# Patient Record
Sex: Female | Born: 1960 | Race: Black or African American | Hispanic: No | Marital: Single | Smoking: Never smoker
Health system: Southern US, Community
[De-identification: ages and names within clinical notes are randomized; demographics above are authoritative.]

## PROBLEM LIST (undated history)

## (undated) DIAGNOSIS — I10 Essential (primary) hypertension: Secondary | ICD-10-CM

## (undated) DIAGNOSIS — M1990 Unspecified osteoarthritis, unspecified site: Secondary | ICD-10-CM

## (undated) HISTORY — DX: Unspecified osteoarthritis, unspecified site: M19.90

## (undated) HISTORY — PX: MYOMECTOMY: SHX85

---

## 1998-10-01 ENCOUNTER — Inpatient Hospital Stay (HOSPITAL_COMMUNITY): Admission: AD | Admit: 1998-10-01 | Discharge: 1998-10-03 | Payer: Self-pay | Admitting: Obstetrics and Gynecology

## 1998-10-01 ENCOUNTER — Encounter: Payer: Self-pay | Admitting: Obstetrics and Gynecology

## 1998-10-03 ENCOUNTER — Encounter (HOSPITAL_COMMUNITY): Admission: RE | Admit: 1998-10-03 | Discharge: 1999-01-01 | Payer: Self-pay | Admitting: Obstetrics and Gynecology

## 1998-12-31 ENCOUNTER — Other Ambulatory Visit: Admission: RE | Admit: 1998-12-31 | Discharge: 1998-12-31 | Payer: Self-pay | Admitting: Obstetrics and Gynecology

## 1999-01-02 ENCOUNTER — Encounter (HOSPITAL_COMMUNITY): Admission: RE | Admit: 1999-01-02 | Discharge: 1999-04-02 | Payer: Self-pay | Admitting: *Deleted

## 2000-01-23 ENCOUNTER — Other Ambulatory Visit: Admission: RE | Admit: 2000-01-23 | Discharge: 2000-01-23 | Payer: Self-pay | Admitting: Obstetrics and Gynecology

## 2002-08-10 ENCOUNTER — Other Ambulatory Visit: Admission: RE | Admit: 2002-08-10 | Discharge: 2002-08-10 | Payer: Self-pay | Admitting: Obstetrics and Gynecology

## 2003-08-31 ENCOUNTER — Other Ambulatory Visit: Admission: RE | Admit: 2003-08-31 | Discharge: 2003-08-31 | Payer: Self-pay | Admitting: Obstetrics and Gynecology

## 2004-10-16 ENCOUNTER — Other Ambulatory Visit: Admission: RE | Admit: 2004-10-16 | Discharge: 2004-10-16 | Payer: Self-pay | Admitting: Obstetrics and Gynecology

## 2005-11-06 ENCOUNTER — Other Ambulatory Visit: Admission: RE | Admit: 2005-11-06 | Discharge: 2005-11-06 | Payer: Self-pay | Admitting: Obstetrics and Gynecology

## 2009-12-18 ENCOUNTER — Encounter: Payer: Self-pay | Admitting: Internal Medicine

## 2009-12-18 LAB — CONVERTED CEMR LAB: Pap Smear: NORMAL

## 2010-03-05 ENCOUNTER — Encounter: Payer: Self-pay | Admitting: Internal Medicine

## 2010-03-19 ENCOUNTER — Ambulatory Visit: Payer: Self-pay | Admitting: Internal Medicine

## 2010-03-19 DIAGNOSIS — E669 Obesity, unspecified: Secondary | ICD-10-CM | POA: Insufficient documentation

## 2010-03-19 DIAGNOSIS — E119 Type 2 diabetes mellitus without complications: Secondary | ICD-10-CM

## 2010-03-24 ENCOUNTER — Telehealth (INDEPENDENT_AMBULATORY_CARE_PROVIDER_SITE_OTHER): Payer: Self-pay | Admitting: *Deleted

## 2010-04-11 ENCOUNTER — Encounter: Payer: Self-pay | Admitting: Gastroenterology

## 2010-05-14 ENCOUNTER — Encounter (INDEPENDENT_AMBULATORY_CARE_PROVIDER_SITE_OTHER): Payer: Self-pay | Admitting: *Deleted

## 2010-05-16 ENCOUNTER — Ambulatory Visit: Payer: Self-pay | Admitting: Gastroenterology

## 2010-05-30 ENCOUNTER — Ambulatory Visit: Payer: Self-pay | Admitting: Gastroenterology

## 2010-06-04 ENCOUNTER — Encounter: Payer: Self-pay | Admitting: Gastroenterology

## 2010-06-12 ENCOUNTER — Encounter: Payer: Self-pay | Admitting: Internal Medicine

## 2010-06-18 ENCOUNTER — Ambulatory Visit: Payer: Self-pay | Admitting: Internal Medicine

## 2010-06-18 LAB — CONVERTED CEMR LAB
ALT: 21 units/L (ref 0–35)
AST: 22 units/L (ref 0–37)
Albumin: 4 g/dL (ref 3.5–5.2)
Alkaline Phosphatase: 99 units/L (ref 39–117)
BUN: 13 mg/dL (ref 6–23)
Basophils Absolute: 0.1 10*3/uL (ref 0.0–0.1)
Basophils Relative: 0.8 % (ref 0.0–3.0)
Bilirubin, Direct: 0.1 mg/dL (ref 0.0–0.3)
CO2: 29 meq/L (ref 19–32)
Calcium: 9.7 mg/dL (ref 8.4–10.5)
Chloride: 101 meq/L (ref 96–112)
Creatinine, Ser: 0.7 mg/dL (ref 0.4–1.2)
Eosinophils Absolute: 0.1 10*3/uL (ref 0.0–0.7)
Eosinophils Relative: 2 % (ref 0.0–5.0)
GFR calc non Af Amer: 111.52 mL/min (ref >60)
Glucose, Bld: 81 mg/dL (ref 70–99)
HCT: 38.7 % (ref 36.0–46.0)
Hemoglobin: 13 g/dL (ref 12.0–15.0)
Hgb A1c MFr Bld: 6.4 % (ref 4.6–6.5)
Lymphocytes Relative: 31.7 % (ref 12.0–46.0)
Lymphs Abs: 2.3 10*3/uL (ref 0.7–4.0)
MCHC: 33.6 g/dL (ref 30.0–36.0)
MCV: 84.2 fL (ref 78.0–100.0)
Monocytes Absolute: 0.5 10*3/uL (ref 0.1–1.0)
Monocytes Relative: 6.4 % (ref 3.0–12.0)
Neutro Abs: 4.2 10*3/uL (ref 1.4–7.7)
Neutrophils Relative %: 59.1 % (ref 43.0–77.0)
Platelets: 275 10*3/uL (ref 150.0–400.0)
Potassium: 4.3 meq/L (ref 3.5–5.1)
RBC: 4.59 M/uL (ref 3.87–5.11)
RDW: 15.9 % — ABNORMAL HIGH (ref 11.5–14.6)
Sodium: 139 meq/L (ref 135–145)
Total Bilirubin: 0.5 mg/dL (ref 0.3–1.2)
Total Protein: 7.5 g/dL (ref 6.0–8.3)
WBC: 7.1 10*3/uL (ref 4.5–10.5)

## 2010-06-22 ENCOUNTER — Encounter: Payer: Self-pay | Admitting: Internal Medicine

## 2010-10-30 NOTE — Procedures (Signed)
Summary: Colonoscopy  Patient: [name] Note: All result statuses are Final unless otherwise noted.  Tests: (1) Colonoscopy (COL)   COL Colonoscopy           DONE     [facility]     [address]     [place], [place]  [address]           COLONOSCOPY PROCEDURE REPORT     PATIENT:  Lauren Bowen, Lauren Bowen  MR#:  [id number]     BIRTHDATE:  [birth year], 49 yrs. old  GENDER:  female     ENDOSCOPIST:  Judie Petit T. Russella Dar, MD, [facility]     Referred by:  Etta Grandchild, M.D.     PROCEDURE DATE:  05/30/2010     PROCEDURE:  Colonoscopy with snare polypectomy     ASA CLASS:  Class II     INDICATIONS:  1) Routine Risk Screening     MEDICATIONS:   Fentanyl 50 mcg IV, Versed 7 mg IV     DESCRIPTION OF PROCEDURE:   After the risks benefits and     alternatives of the procedure were thoroughly explained, informed     consent was obtained.  Digital rectal exam was performed and     revealed no abnormalities.   The LB [id number]-[id number] endoscope     was introduced through the anus and advanced to the cecum, which     was identified by both the appendix and ileocecal valve, without     limitations.  The quality of the prep was excellent, using     MoviPrep.  The instrument was then slowly withdrawn as the colon     was fully examined.     <<PROCEDUREIMAGES>>     FINDINGS:  A pedunculated polyp was found in the descending colon.     It was 6 mm in size. Polyp was snared without cautery. Retrieval     was successful. A normal appearing cecum, ileocecal valve, and     appendiceal orifice were identified. The ascending, hepatic     flexure, transverse, splenic flexure, sigmoid colon, and rectum     appeared unremarkable.  Retroflexed views in the rectum revealed     no abnormalities. The time to cecum =  3.75  minutes. The scope     was then withdrawn (time =  9.5  min) from the patient and the     procedure completed.           COMPLICATIONS:  None           ENDOSCOPIC IMPRESSION:     1) 6 mm  pedunculated polyp in the descending colon           RECOMMENDATIONS:     1) Await pathology results     2) If the polyp removed is adenomatous (pre-cancerous), you will     need a repeat colonoscopy in 5 years. Otherwise follow colorectal     cancer screening for "routine risk" patients with colonoscopy in     10 years.           Venita Lick. Russella Dar, MD, [facility]           n.     eSIGNED:   Venita Lick. Stark at 05/30/2010 10:09 AM           Adrian Prince, [id number]  Note: An exclamation mark (!) indicates a result that was not dispersed into the flowsheet. Document Creation Date: 05/30/2010 10:10 AM _______________________________________________________________________  (1) Order  result status: Final Collection or observation date-time: 05/30/2010 09:57 Requested date-time:  Receipt date-time:  Reported date-time:  Referring Physician:   Ordering Physician: Claudette Head [phone]) Specimen Source:  Source: Launa Grill Order Number: [id number] Lab site:   Appended Document: Colonoscopy     Procedures Next Due Date:    Colonoscopy: 05/2020

## 2010-10-30 NOTE — Letter (Signed)
Summary: Results Follow-up Letter  [facility]  [address] [place], [place] [address]   Phone: [phone]  Fax: [phone]    06/22/2010  [address] [place], [place]  [address]  Dear Ms. Haraway,   The following are the results of your recent test(s):  Test       Result     Average blood sugars   normal Liver/kidney     normal Thyroid       normal   _________________________________________________________  Please call for an appointment as directed _________________________________________________________ _________________________________________________________ _________________________________________________________  Sincerely,  Sanda Linger MD [facility]

## 2010-10-30 NOTE — Letter (Signed)
Summary: Previsit letter  [facility]  [address] [place], [place] [address]   Phone: [phone]  Fax: [phone]       04/11/2010 MRN: [id number]  [facility] [address] [place], [place]  [address]  Dear Ms. Bonsignore,  Welcome to the Gastroenterology Division at [facility].    You are scheduled to see a nurse for your pre-procedure visit on 05-16-10 at 10:30am on the 3rd floor at [facility], [address]  We ask that you try to arrive at our office 15 minutes prior to your appointment time to allow for check-in.  Your nurse visit will consist of discussing your medical and surgical history, your immediate family medical history, and your medications.    Please bring a complete list of all your medications or, if you prefer, bring the medication bottles and we will list them.  We will need to be aware of both prescribed and over the counter drugs.  We will need to know exact dosage information as well.  If you are on blood thinners (Coumadin, Plavix, Aggrenox, Ticlid, etc.) please call our office today/prior to your appointment, as we need to consult with your physician about holding your medication.   Please be prepared to read and sign documents such as consent forms, a financial agreement, and acknowledgement forms.  If necessary, and with your consent, a friend or relative is welcome to sit-in on the nurse visit with you.  Please bring your insurance card so that we may make a copy of it.  If your insurance requires a referral to see a specialist, please bring your referral form from your primary care physician.  No co-pay is required for this nurse visit.     If you cannot keep your appointment, please call [phone] to cancel or reschedule prior to your appointment date.  This allows [place] the opportunity to schedule an appointment for another patient in need of care.    Thank you for choosing  Gastroenterology for your medical  needs.  We appreciate the opportunity to care for you.  Please visit [place] at our website  to learn more about our practice.                     Sincerely.                                                                                                                   The Gastroenterology Division

## 2010-10-30 NOTE — Letter (Signed)
Summary: [facility] Instructions  [facility]  [address] [place], [place] [address]   Phone: [phone]  Fax: [phone]       Lauren Bowen    [birth year]    MRN: [id number]        Procedure Day /Date:  Friday 05/30/2010     Arrival Time: 8:30 am      Procedure Time: 9:30 am     Location of Procedure:                    _x _  [facility] (4th Floor)                        PREPARATION FOR COLONOSCOPY WITH MOVIPREP   Starting 5 days prior to your procedure Sunday 8/31 do not eat nuts, seeds, popcorn, corn, beans, peas,  salads, or any raw vegetables.  Do not take any fiber supplements (e.g. Metamucil, Citrucel, and Benefiber).  THE DAY BEFORE YOUR PROCEDURE         DATE: Thursday 9/1  1.  Drink clear liquids the entire day-NO SOLID FOOD  2.  Do not drink anything colored red or purple.  Avoid juices with pulp.  No orange juice.  3.  Drink at least 64 oz. (8 glasses) of fluid/clear liquids during the day to prevent dehydration and help the prep work efficiently.  CLEAR LIQUIDS INCLUDE: Water Jello Ice Popsicles Tea (sugar ok, no milk/cream) Powdered fruit flavored drinks Coffee (sugar ok, no milk/cream) Gatorade Juice: apple, white grape, white cranberry  Lemonade Clear bullion, consomm, broth Carbonated beverages (any kind) Strained chicken noodle soup Hard Candy                             4.  In the morning, mix first dose of MoviPrep solution:    Empty 1 Pouch A and 1 Pouch B into the disposable container    Add lukewarm drinking water to the top line of the container. Mix to dissolve    Refrigerate (mixed solution should be used within 24 hrs)  5.  Begin drinking the prep at 5:00 p.m. The MoviPrep container is divided by 4 marks.   Every 15 minutes drink the solution down to the next mark (approximately 8 oz) until the full liter is complete.   6.  Follow completed prep with 16 oz of clear liquid of your choice (Nothing red  or purple).  Continue to drink clear liquids until bedtime.  7.  Before going to bed, mix second dose of MoviPrep solution:    Empty 1 Pouch A and 1 Pouch B into the disposable container    Add lukewarm drinking water to the top line of the container. Mix to dissolve    Refrigerate  THE DAY OF YOUR PROCEDURE      DATE: Friday 9/2  Beginning at 4:30 a.m. (5 hours before procedure):         1. Every 15 minutes, drink the solution down to the next mark (approx 8 oz) until the full liter is complete.  2. Follow completed prep with 16 oz. of clear liquid of your choice.    3. You may drink clear liquids until 7:30 am (2 HOURS BEFORE PROCEDURE).   MEDICATION INSTRUCTIONS  Unless otherwise instructed, you should take regular prescription medications with a small sip of water   as early as possible the morning of  your procedure.     Additional medication instructions: n/a         OTHER INSTRUCTIONS  You will need a responsible adult at least 52 years of age to accompany you and drive you home.   This person must remain in the waiting room during your procedure.  Wear loose fitting clothing that is easily removed.  Leave jewelry and other valuables at home.  However, you may wish to bring a book to read or  an iPod/MP3 player to listen to music as you wait for your procedure to start.  Remove all body piercing jewelry and leave at home.  Total time from sign-in until discharge is approximately 2-3 hours.  You should go home directly after your procedure and rest.  You can resume normal activities the  day after your procedure.  The day of your procedure you should not:   Drive   Make legal decisions   Operate machinery   Drink alcohol   Return to work  You will receive specific instructions about eating, activities and medications before you leave.    The above instructions have been reviewed and explained to me by   Sherren Kerns RN  May 16, 2010 10:56  AM     I fully understand and can verbalize these instructions _____________________________ Date _________

## 2010-10-30 NOTE — Letter (Signed)
Summary: [facility]  [facility]   Imported By: Sherian Rein 06/16/2010 10:35:51  _____________________________________________________________________  External Attachment:    Type:   Image     Comment:   External Document

## 2010-10-30 NOTE — Letter (Signed)
Summary: [facility]   Imported By: Lennie Odor 03/27/2010 14:53:08  _____________________________________________________________________  External Attachment:    Type:   Image     Comment:   External Document

## 2010-10-30 NOTE — Letter (Signed)
Summary: Patient Notice- Colon Biospy Results  [facility]  [address] [place], [place] [address]   Phone: [phone]  Fax: [phone]        June 04, 2010 MRN: [id number]    Lauren Bowen [address] [place], [place]  [address]    Dear Ms. Wescoat,  I am pleased to inform you that the biopsies taken during your recent colonoscopy did not show any evidence of cancer or precancerous changes upon pathologic examination. The biopsy showed granulation tissue.   Continue with the treatment plan as outlined on the day of your      exam.  You should have a repeat colonoscopy examination in 10 years.  Please call us if you are having persistent problems or have questions about your condition that have not been fully answered at this time.  Sincerely,  Meryl Dare MD [facility]  This letter has been electronically signed by your physician.  Appended Document: Patient Notice- Colon Biospy Results letter mailed

## 2010-10-30 NOTE — Assessment & Plan Note (Signed)
Summary: 3 mos f/u #/cd   Vital Signs:  Patient profile:   50 year old female Height:      63 inches Weight:      348.50 pounds BMI:     61.96 O2 Sat:      99 % on Room air Temp:     97.7 degrees F oral Pulse rate:   78 / minute Pulse rhythm:   regular Resp:     16 per minute BP sitting:   128 / 88  (left arm) Cuff size:   large  Vitals Entered By: Rock Nephew CMA (June 18, 2010 3:18 PM)  Nutrition Counseling: Patient's BMI is greater than 25 and therefore counseled on weight management options.  O2 Flow:  Room air  Primary Care Provider:  Etta Grandchild MD   History of Present Illness:  Follow-Up Visit      This is a 50 year old woman who presents for Follow-up visit.  The patient denies chest pain, palpitations, dizziness, syncope, low blood sugar symptoms, high blood sugar symptoms, edema, SOB, DOE, PND, and orthopnea.  Since the last visit the patient notes no new problems or concerns.  The patient reports taking meds as prescribed and dietary compliance.  When questioned about possible medication side effects, the patient notes none.    Preventive Screening-Counseling & Management  Alcohol-Tobacco     Alcohol drinks/day: 0     Smoking Status: never     Passive Smoke Exposure: no     Tobacco Counseling: not indicated; no tobacco use  Current Medications (verified): 1)  None  Allergies (verified): No Known Drug Allergies  Past History:  Past Medical History: Last updated: 03/19/2010 Unremarkable  Past Surgical History: Last updated: 03/19/2010 Denies surgical history  Family History: Last updated: 03/19/2010 Family History Diabetes 1st degree relative Family History High cholesterol Family History Hypertension Family History of Stroke M 1st degree relative <50  Social History: Last updated: 03/19/2010 Occupation: Technical brewer Married Never Smoked Alcohol use-no Drug use-no Regular exercise-yes  Risk Factors: Alcohol Use: 0  (06/18/2010) Exercise: yes (03/19/2010)  Risk Factors: Smoking Status: never (06/18/2010) Passive Smoke Exposure: no (06/18/2010)  Family History: Reviewed history from 03/19/2010 and no changes required. Family History Diabetes 1st degree relative Family History High cholesterol Family History Hypertension Family History of Stroke M 1st degree relative <50  Social History: Reviewed history from 03/19/2010 and no changes required. 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   Endo:  Denies cold intolerance, excessive hunger, excessive thirst, excessive urination, heat intolerance, polyuria, and weight change.  Physical Exam  General:  alert, well-developed, well-nourished, well-hydrated, appropriate dress, normal appearance, healthy-appearing, cooperative to examination, good hygiene, and overweight-appearing.   Head:  normocephalic and atraumatic.   Mouth:  Oral mucosa and oropharynx without lesions or exudates.  Teeth in good repair. Neck:  supple, full ROM, no masses, no thyromegaly, no thyroid nodules or tenderness, no JVD, and normal carotid upstroke.   Lungs:  normal respiratory effort, no intercostal retractions, no accessory muscle use, normal breath sounds, no dullness, no fremitus, no crackles, and no wheezes.   Heart:  normal rate, regular rhythm, no murmur, no gallop, no rub, and no JVD.   Abdomen:  soft, non-tender, normal bowel sounds, no distention, no masses, no guarding, no rigidity, no hepatomegaly,  and no splenomegaly.   Msk:  normal ROM, no joint tenderness, no joint swelling, no joint warmth, no redness over joints, no joint deformities, no joint instability, no crepitation, and no muscle atrophy.   Pulses:  R and L  carotid,radial,femoral,dorsalis pedis and posterior tibial pulses are full and equal bilaterally Extremities:  trace left pedal edema and trace right pedal edema.   Neurologic:  No cranial nerve deficits noted. Station and gait are normal. Plantar reflexes are down-going bilaterally. DTRs are symmetrical throughout. Sensory, motor and coordinative functions appear intact. Skin:  turgor normal, color normal, no rashes, no suspicious lesions, no ecchymoses, no petechiae, no purpura, no ulcerations, and no edema.   Cervical Nodes:  no anterior cervical adenopathy and no posterior cervical adenopathy.   Psych:  Cognition and judgment appear intact. Alert and cooperative with normal attention span and concentration. No apparent delusions, illusions, hallucinations  Diabetes Management Exam:    Foot Exam (with socks and/or shoes not present):       Sensory-Pinprick/Light touch:          Left medial foot (L-4): normal          Left dorsal foot (L-5): normal          Left lateral foot (S-1): normal          Right medial foot (L-4): normal          Right dorsal foot (L-5): normal          Right lateral foot (S-1): normal       Sensory-Monofilament:          Left foot: normal          Right foot: normal       Inspection:          Left foot: normal          Right foot: normal       Nails:          Left foot: normal          Right foot: normal   Impression & Recommendations:  Problem # 1:  DIABETES-TYPE 2 (ICD-250.00) Assessment Unchanged  Orders: Venipuncture (04540) TLB-BMP (Basic Metabolic Panel-BMET) (80048-METABOL) TLB-CBC Platelet - w/Differential (85025-CBCD) TLB-Hepatic/Liver Function Pnl (80076-HEPATIC) TLB-A1C / Hgb A1C (Glycohemoglobin) (83036-A1C)  Problem # 2:  OBESITY (ICD-278.00) Assessment: Improved  Orders: Venipuncture ([address]) TLB-BMP (Basic Metabolic Panel-BMET) (80048-METABOL) TLB-CBC Platelet - w/Differential (85025-CBCD) TLB-Hepatic/Liver Function Pnl  (80076-HEPATIC) TLB-A1C / Hgb A1C (Glycohemoglobin) (83036-A1C)  Ht: 63 (06/18/2010)   Wt: 348.50 (06/18/2010)   BMI: 61.96 (06/18/2010)  Patient Instructions: 1)  Please schedule a follow-up appointment in 3 months. 2)  It is important that you exercise regularly at least 20 minutes 5 times a week. If you develop chest pain, have severe difficulty breathing, or feel very tired , stop exercising immediately and seek medical attention. 3)  You need to lose weight. Consider a lower calorie diet and regular exercise.  4)  Check your blood sugars regularly. If your readings are usually above 200 or below 70 you should contact our office. 5)  It is important that your Diabetic A1c level is checked every 3 months. 6)  See your eye doctor yearly to check for diabetic eye damage. 7)  Check your feet each night for sore areas, calluses or signs of infection. 8)  Check your Blood Pressure regularly. If it is above 130/80: you should make an appointment.

## 2010-10-30 NOTE — Progress Notes (Signed)
  Phone Note Other Incoming   Request: Send information Summary of Call: Records received from [facility]. 13 pages forwarded to Dr. Yetta Barre for review.      Appended Document:  correction 6 pages forwarded to Dr. Yetta Barre for review.

## 2010-10-30 NOTE — Miscellaneous (Signed)
Summary: previsit/rm  Clinical Lists Changes  Medications: Added new medication of MOVIPREP 100 GM  SOLR (PEG-KCL-NACL-NASULF-NA ASC-C) As per prep instructions. - Signed Rx of MOVIPREP 100 GM  SOLR (PEG-KCL-NACL-NASULF-NA ASC-C) As per prep instructions.;  #1 x 0;  Signed;  Entered by: Sherren Kerns RN;  Authorized by: Meryl Dare MD [facility];  Method used: Electronically to [facility]. #[id number]*, [address], [place], [place]  [address], Ph: [phone], Fax: [phone] Observations: Added new observation of ALLERGY REV: Done (05/16/2010 10:16)    Prescriptions: MOVIPREP 100 GM  SOLR (PEG-KCL-NACL-NASULF-NA ASC-C) As per prep instructions.  #1 x 0   Entered by:   Sherren Kerns RN   Authorized by:   Meryl Dare MD [facility]   Signed by:   Sherren Kerns RN on 05/16/2010   Method used:   Electronically to        [id number] (retail)       [address] [place], [place]  [address]       Ph: [phone]       Fax: [phone]   RxID:   [id number]

## 2010-10-30 NOTE — Assessment & Plan Note (Signed)
Summary: NEW [employer] PT--ELEVATED GLUCOSE/A1C-PER KAREN/DR TOMBLIN-#--STC   Vital Signs:  Patient profile:   50 year old female Height:      63 inches Weight:      354 pounds BMI:     62.93 O2 Sat:      93 % on Room air Temp:     97.8 degrees F oral Pulse rate:   90 / minute Pulse rhythm:   regular Resp:     16 per minute BP sitting:   114 / 74  (left arm) Cuff size:   large  Vitals Entered By: Rock Nephew CMA (March 19, 2010 1:19 PM)  Nutrition Counseling: Patient's BMI is greater than 25 and therefore counseled on weight management options.  O2 Flow:  Room air  Primary Care Provider:  Etta Grandchild MD   History of Present Illness: New to me she was referreed by [facility] for A1C=6.1. She does not want to start meds., instead she is losing weight with diet and exercise.  Preventive Screening-Counseling & Management  Alcohol-Tobacco     Alcohol drinks/day: 0     Smoking Status: never     Passive Smoke Exposure: no  Caffeine-Diet-Exercise     Does Patient Exercise: yes     Type of exercise: walking     Exercise (avg: min/session): 30-60     Times/week: 4     Exercise Counseling: to improve exercise regimen  Hep-HIV-STD-Contraception     Hepatitis Risk: no risk noted     HIV Risk: no risk noted     STD Risk: no risk noted     SBE monthly: yes     SBE Education/Counseling: to perform regular SBE  Safety-Violence-Falls     Seat Belt Use: yes     Helmet Use: yes     Firearms in the Home: no firearms in the home     Smoke Detectors: yes     Violence in the Home: no risk noted     Sexual Abuse: no      Sexual History:  currently monogamous.        Drug Use:  no.        Blood Transfusions:  no.    Current Medications (verified): 1)  None  Allergies (verified): No Known Drug Allergies  Past History:  Past Medical History: Unremarkable  Past Surgical History: Denies surgical history  Family History: Family History Diabetes 1st degree relative Family  History High cholesterol Family History Hypertension Family History of Stroke M 1st degree relative <50  Social History: Occupation: Technical brewer Married Never Smoked Alcohol use-no Drug use-no Regular exercise-yes Smoking Status:  never Does Patient Exercise:  yes Passive Smoke Exposure:  no Hepatitis Risk:  no risk noted HIV Risk:  no risk noted STD Risk:  no risk noted Seat Belt Use:  yes Sexual History:  currently monogamous Blood Transfusions:  no Drug Use:  no  Review of Systems  The patient denies chest pain, syncope, dyspnea on exertion, peripheral edema, prolonged cough, headaches, hemoptysis, abdominal pain, melena, hematochezia, severe indigestion/heartburn, and enlarged lymph nodes.   Endo:  Complains of excessive thirst; denies cold intolerance, excessive hunger, excessive urination, heat intolerance, polyuria, and weight change.  Physical Exam  General:  alert, well-developed, well-nourished, well-hydrated, appropriate dress, normal appearance, healthy-appearing, cooperative to examination, good hygiene, and overweight-appearing.   Head:  normocephalic and atraumatic.   Eyes:  vision grossly intact and no injection.   Mouth:  Oral mucosa and oropharynx without lesions or  exudates.  Teeth in good repair. Neck:  supple, full ROM, no masses, no thyromegaly, no thyroid nodules or tenderness, no JVD, and normal carotid upstroke.   Lungs:  normal respiratory effort, no intercostal retractions, no accessory muscle use, normal breath sounds, no dullness, no fremitus, no crackles, and no wheezes.   Heart:  normal rate, regular rhythm, no murmur, no gallop, no rub, and no JVD.   Abdomen:  soft, non-tender, normal bowel sounds, no distention, no masses, no guarding, no rigidity, no hepatomegaly, and no splenomegaly.   Msk:  normal ROM, no joint tenderness, no joint swelling, no joint warmth, no redness over joints, no joint deformities, no joint instability, no crepitation, and  no muscle atrophy.   Pulses:  R and L carotid,radial,femoral,dorsalis pedis and posterior tibial pulses are full and equal bilaterally Extremities:  trace left pedal edema and trace right pedal edema.   Neurologic:  No cranial nerve deficits noted. Station and gait are normal. Plantar reflexes are down-going bilaterally. DTRs are symmetrical throughout. Sensory, motor and coordinative functions appear intact. Skin:  turgor normal, color normal, no rashes, no suspicious lesions, no ecchymoses, no petechiae, no purpura, no ulcerations, and no edema.   Cervical Nodes:  no anterior cervical adenopathy and no posterior cervical adenopathy.   Axillary Nodes:  no R axillary adenopathy and no L axillary adenopathy.   Inguinal Nodes:  no R inguinal adenopathy and no L inguinal adenopathy.   Psych:  Cognition and judgment appear intact. Alert and cooperative with normal attention span and concentration. No apparent delusions, illusions, hallucinations   Impression & Recommendations:  Problem # 1:  DIABETES-TYPE 2 (ICD-250.00) Assessment New  Orders: Nutrition Referral (Nutrition)  Problem # 2:  OBESITY (ICD-278.00) Assessment: New  Orders: Nutrition Referral (Nutrition)  Ht: 63 (03/19/2010)   Wt: 354 (03/19/2010)   BMI: 62.93 (03/19/2010)  Problem # 3:  ROUTINE GENERAL MEDICAL EXAM@HEALTH  CARE FACL (ICD-V70.0)  Orders: Gastroenterology Referral (GI)  Mammogram: normal (03/05/2010) Pap smear: normal (12/18/2009) Td Booster: Tdap (04/02/2005)    Discussed using sunscreen, use of alcohol, drug use, self breast exam, routine dental care, routine eye care, schedule for GYN exam, routine physical exam, seat belts, multiple vitamins, osteoporosis prevention, adequate calcium intake in diet, recommendations for immunizations, mammograms and Pap smears.  Discussed exercise and checking cholesterol.    Colorectal Screening:  Current Recommendations:    Colonoscopy recommended: scheduled with  [facility]  PAP Screening:    Hx Cervical Dysplasia in last 5 yrs? No    3 normal PAP smears in last 5 yrs? Yes    Last PAP smear:  12/18/2009  Mammogram Screening:    Last Mammogram:  03/05/2010  Osteoporosis Risk Assessment:  Risk Factors for Fracture or Low Bone Density:   Race (White or Asian):     no   Hx of Fractures:       no   FH of Osteoporosis:     no   Hx of falls:       no   Physically inactive:     no   Smoking status:       never   High alcohol use:     no   High caffeine use:     no   Low calcium/Vit. D intake:   no   Corticosteroid use:     no   Thyroid replacement:     no   Dilantin use:       no   Heparin use:  no   Thyroid disease:     no   Rheumatoid Arthritis:     no   Parathyroid disease:     no  Immunization & Chemoprophylaxis:    Tetanus vaccine: Tdap  (04/02/2005)  Patient Instructions: 1)  Please schedule a follow-up appointment in 3 months. 2)  It is important that you exercise regularly at least 20 minutes 5 times a week. If you develop chest pain, have severe difficulty breathing, or feel very tired , stop exercising immediately and seek medical attention. 3)  You need to lose weight. Consider a lower calorie diet and regular exercise.  4)  Check your blood sugars regularly. If your readings are usually above 200 or below 70 you should contact our office. 5)  It is important that your Diabetic A1c level is checked every 3 months. 6)  See your eye doctor yearly to check for diabetic eye damage. 7)  Check your feet each night for sore areas, calluses or signs of infection.  Preventive Care Screening  Mammogram:    Date:  03/05/2010    Results:  normal   Pap Smear:    Date:  12/18/2009    Results:  normal     Tetanus/Td Immunization History:    Tetanus/Td # 1:  Tdap (04/02/2005)

## 2010-10-30 NOTE — Letter (Signed)
[facility]   Imported By: Sherian Rein 03/21/2010 10:13:52  _____________________________________________________________________  External Attachment:    Type:   Image     Comment:   External Document

## 2010-10-30 NOTE — Letter (Signed)
Summary: [facility]   Imported By: Sherian Rein 03/21/2010 10:11:55  _____________________________________________________________________  External Attachment:    Type:   Image     Comment:   External Document

## 2011-01-01 ENCOUNTER — Encounter: Payer: Self-pay | Admitting: Internal Medicine

## 2012-06-10 ENCOUNTER — Ambulatory Visit (INDEPENDENT_AMBULATORY_CARE_PROVIDER_SITE_OTHER): Payer: BC Managed Care – PPO | Admitting: Physician Assistant

## 2012-06-10 VITALS — BP 156/93 | HR 84 | Temp 100.4°F | Resp 16 | Ht 67.0 in | Wt 354.0 lb

## 2012-06-10 DIAGNOSIS — B029 Zoster without complications: Secondary | ICD-10-CM

## 2012-06-10 MED ORDER — VALACYCLOVIR HCL 1 G PO TABS: 1.0000 g | ORAL_TABLET | Freq: Three times a day (TID) | ORAL | Status: AC

## 2012-06-10 NOTE — Progress Notes (Signed)
  Subjective:    Patient ID: Lauren Bowen, female    DOB: May 02, 1961, 51 y.o.   MRN: [id number]  HPI  Pt presents to clinic with a rash on her back for about 1 wk.  She thought it was her bra that was bothering her but then yesterday she noticed a red blistered rash on her R breast.  She had chicken pox as a child.  The rash is pin prickly and itchy, not significantly painful.  Review of Systems  Skin: Positive for rash.  Neurological: Negative for headaches.       Objective:   Physical Exam  Vitals reviewed. Constitutional: She is oriented to person, place, and time. She appears well-developed and well-nourished.  HENT:  Head: Normocephalic and atraumatic.  Right Ear: External ear normal.  Left Ear: External ear normal.  Eyes: Conjunctivae normal are normal.  Pulmonary/Chest: Effort normal.  Neurological: She is alert and oriented to person, place, and time.  Skin: Skin is warm and dry. Rash noted.       Vesicular scabbed over rash on R back and new erythematous rash on R breast.  This rash is all in a single dermatome.  Psychiatric: She has a normal mood and affect. Her behavior is normal. Judgment and thought content normal.          Assessment & Plan:   1. Shingles  valACYclovir (VALTREX) 1000 MG tablet   D/w pt post herpatic neuralgia and if she develops pain to RTC for start of neurontin.  Pt understands and agrees.

## 2013-01-27 ENCOUNTER — Ambulatory Visit (INDEPENDENT_AMBULATORY_CARE_PROVIDER_SITE_OTHER): Payer: BC Managed Care – PPO | Admitting: Physician Assistant

## 2013-01-27 VITALS — BP 130/80 | HR 83 | Temp 99.0°F | Resp 18 | Ht 64.0 in | Wt 362.0 lb

## 2013-01-27 DIAGNOSIS — R062 Wheezing: Secondary | ICD-10-CM

## 2013-01-27 DIAGNOSIS — R05 Cough: Secondary | ICD-10-CM

## 2013-01-27 MED ORDER — HYDROCOD POLST-CHLORPHEN POLST 10-8 MG/5ML PO LQCR: 5.0000 mL | Freq: Two times a day (BID) | ORAL | Status: DC | PRN

## 2013-01-27 MED ORDER — IPRATROPIUM BROMIDE 0.02 % IN SOLN
0.5000 mg | Freq: Once | RESPIRATORY_TRACT | Status: AC
  Administered 2013-01-27: 0.5 mg via RESPIRATORY_TRACT

## 2013-01-27 MED ORDER — AZITHROMYCIN 500 MG PO TABS: 500.0000 mg | ORAL_TABLET | Freq: Every day | ORAL | Status: DC

## 2013-01-27 MED ORDER — ALBUTEROL SULFATE (2.5 MG/3ML) 0.083% IN NEBU
2.5000 mg | INHALATION_SOLUTION | Freq: Once | RESPIRATORY_TRACT | Status: AC
  Administered 2013-01-27: 2.5 mg via RESPIRATORY_TRACT

## 2013-01-27 MED ORDER — ALBUTEROL SULFATE HFA 108 (90 BASE) MCG/ACT IN AERS: 2.0000 | INHALATION_SPRAY | RESPIRATORY_TRACT | Status: DC | PRN

## 2013-01-27 NOTE — Progress Notes (Signed)
  Subjective:    Patient ID: Lauren Bowen, female    DOB: May 14, 1961, 52 y.o.   MRN: [id number]  HPI This 52 y.o. female presents for evaluation of cough x 1 week.  Worse with prolonged talking and after eating. No SOB, or pain with breathing.  Mild runny nose, though may have more in the mornings.  No sore throat, ear pressure or pain.  Some nausea and vomiting last week, resolved entirely.  May have been associated with lots of phlegm, or something she ate, she doesn't recall.  No fever or chills, unexplained myalgias/arthralgias or rash.  Past medical history, surgical history, family history, social history and problem list reviewed.   Review of Systems As above.    Objective:   Physical Exam  Blood pressure 130/80, pulse 83, temperature 99 F (37.2 C), temperature source Oral, resp. rate 18, height 5\' 4"  (1.626 m), weight 362 lb (164.202 kg), SpO2 98.00%. Body mass index is 62.11 kg/(m^2). Well-developed, well nourished obese BF who is awake, alert and oriented, in NAD. HEENT: [place]/AT, PERRL, EOMI.  Sclera and conjunctiva are clear.  EAC are patent, TMs are normal in appearance. Nasal mucosa is pink and moist. OP is clear. Neck: supple, non-tender, no lymphadenopathy, thyromegaly. Heart: RRR, no murmur Lungs: normal effort, but dry cough throughout interview and exam.  Wheezes throughout. Extremities: no cyanosis, clubbing or edema. Skin: warm and dry without rash. Psychologic: good mood and appropriate affect, normal speech and behavior.  Albuterol + Atrovent neb treatment with near-complete resolution of the wheezing, and significant improvement in subjective symptoms.     Assessment & Plan:  Cough - Plan: azithromycin (ZITHROMAX) 500 MG tablet, chlorpheniramine-HYDROcodone (TUSSIONEX PENNKINETIC ER) 10-8 MG/5ML LQCR  Wheezing - Plan: albuterol (PROVENTIL) (2.5 MG/3ML) 0.083% nebulizer solution 2.5 mg, ipratropium (ATROVENT) nebulizer solution 0.5 mg, albuterol (PROVENTIL  HFA;VENTOLIN HFA) 108 (90 BASE) MCG/ACT inhaler  There is a question of diabetes in this patient.  I encouraged her to bring in her recent lab results for review, either here or to her GYN.  She is actively working on weight loss through a professional program (that drew the blood).  Fernande Bras, PA-C Physician Assistant-Certified [facility]

## 2013-01-27 NOTE — Patient Instructions (Addendum)
Get plenty of rest and drink at least 64 ounces of water daily.  Bring in your recent lab results so we can make sure we're doing everything needed to keep you as healthy as possible.

## 2013-05-29 ENCOUNTER — Ambulatory Visit: Payer: Self-pay | Admitting: Physician Assistant

## 2015-05-24 ENCOUNTER — Encounter: Payer: Self-pay | Admitting: Gastroenterology

## 2016-01-20 ENCOUNTER — Ambulatory Visit (INDEPENDENT_AMBULATORY_CARE_PROVIDER_SITE_OTHER): Payer: BLUE CROSS/BLUE SHIELD | Admitting: Family Medicine

## 2016-01-20 VITALS — BP 124/80 | HR 74 | Temp 102.4°F | Resp 18 | Ht 63.5 in | Wt 361.0 lb

## 2016-01-20 DIAGNOSIS — J039 Acute tonsillitis, unspecified: Secondary | ICD-10-CM

## 2016-01-20 DIAGNOSIS — R350 Frequency of micturition: Secondary | ICD-10-CM | POA: Diagnosis not present

## 2016-01-20 DIAGNOSIS — J020 Streptococcal pharyngitis: Secondary | ICD-10-CM

## 2016-01-20 DIAGNOSIS — J029 Acute pharyngitis, unspecified: Secondary | ICD-10-CM | POA: Diagnosis not present

## 2016-01-20 DIAGNOSIS — R509 Fever, unspecified: Secondary | ICD-10-CM

## 2016-01-20 LAB — POCT URINALYSIS DIP (MANUAL ENTRY)
Bilirubin, UA: NEGATIVE
Glucose, UA: NEGATIVE
Ketones, POC UA: NEGATIVE
Leukocytes, UA: NEGATIVE
Nitrite, UA: NEGATIVE
Protein Ur, POC: NEGATIVE
Spec Grav, UA: 1.02
Urobilinogen, UA: 0.2
pH, UA: 6

## 2016-01-20 LAB — POC MICROSCOPIC URINALYSIS (UMFC): Mucus: ABSENT

## 2016-01-20 LAB — POCT RAPID STREP A (OFFICE): Rapid Strep A Screen: POSITIVE — AB

## 2016-01-20 MED ORDER — PENICILLIN V POTASSIUM 500 MG PO TABS: 500.0000 mg | ORAL_TABLET | Freq: Three times a day (TID) | ORAL | Status: DC

## 2016-01-20 NOTE — Progress Notes (Signed)
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 She is been sick for 2 days with urinary frequency and sore throat. She also has a fever today and was unable to get up and go to work.  Patient's primary care doctor is Dr. Ronnald Ramp on [address] [place]    Objective: BP 124/80 mmHg  Pulse 74  Temp(Src) 102.4 F (39.1 C) (Oral)  Resp 18  Ht 5' 3.5" (1.613 m)  Wt 361 lb (163.749 kg)  BMI 62.94 kg/m2  SpO2 96% This is an obese woman, middle-aged, in no acute distress. Her voice is somewhat hoarse and raspy. Oropharynx shows diffuse white exudates on both sides of her posterior pharynx covering her tonsils. Neck: Supple no adenopathy Chest: Clear Heart: Regular no murmur CVA, nontender  Results for orders placed or performed in visit on 01/20/16  POCT rapid strep A  Result Value Ref Range   Rapid Strep A Screen Positive (A) Negative  POCT urinalysis dipstick  Result Value Ref Range   Color, UA yellow yellow   Clarity, UA clear clear   Glucose, UA negative negative   Bilirubin, UA negative negative   Ketones, POC UA negative negative   Spec Grav, UA 1.020    Blood, UA trace-intact (A) negative   pH, UA 6.0    Protein Ur, POC negative negative   Urobilinogen, UA 0.2    Nitrite, UA Negative Negative   Leukocytes, UA Negative Negative  POCT Microscopic Urinalysis ([facility])  Result Value Ref Range   WBC,UR,HPF,POC Few (A) None WBC/hpf   RBC,UR,HPF,POC Few (A) None RBC/hpf   Bacteria Few (A) None, Too numerous to count   Mucus Absent Absent   Epithelial Cells, UR Per Microscopy Few (A) None, Too numerous to count cells/hpf   Fever, unspecified fever cause - Plan: penicillin v potassium (VEETID) 500 MG tablet  Sore throat - Plan: penicillin v potassium (VEETID) 500 MG tablet  Acute tonsillitis, unspecified etiology - Plan: POCT rapid strep A, penicillin v potassium (VEETID) 500 MG tablet  Urinary frequency - Plan: POCT urinalysis dipstick, POCT  Microscopic Urinalysis ([facility])  Streptococcal sore throat - Plan: penicillin v potassium (VEETID) 500 MG tablet  Robyn Haber, MD

## 2016-01-20 NOTE — Patient Instructions (Addendum)
Strep Throat Strep throat is a bacterial infection of the throat. Your health care provider may call the infection tonsillitis or pharyngitis, depending on whether there is swelling in the tonsils or at the back of the throat. Strep throat is most common during the cold months of the year in children who are 74-57 years of age, but it can happen during any season in people of any age. This infection is spread from person to person (contagious) through coughing, sneezing, or close contact. CAUSES Strep throat is caused by the bacteria called Streptococcus pyogenes. RISK FACTORS This condition is more likely to develop in:  People who spend time in crowded places where the infection can spread easily.  People who have close contact with someone who has strep throat. SYMPTOMS Symptoms of this condition include:  Fever or chills.   Redness, swelling, or pain in the tonsils or throat.  Pain or difficulty when swallowing.  White or yellow spots on the tonsils or throat.  Swollen, tender glands in the neck or under the jaw.  Red rash all over the body (rare). DIAGNOSIS This condition is diagnosed by performing a rapid strep test or by taking a swab of your throat (throat culture test). Results from a rapid strep test are usually ready in a few minutes, but throat culture test results are available after one or two days. TREATMENT This condition is treated with antibiotic medicine. HOME CARE INSTRUCTIONS Medicines  Take over-the-counter and prescription medicines only as told by your health care provider.  Take your antibiotic as told by your health care provider. Do not stop taking the antibiotic even if you start to feel better.  Have family members who also have a sore throat or fever tested for strep throat. They may need antibiotics if they have the strep infection. Eating and Drinking  Do not share food, drinking cups, or personal items that could cause the infection to spread  to other people.  If swallowing is difficult, try eating soft foods until your sore throat feels better.  Drink enough fluid to keep your urine clear or pale yellow. General Instructions  Gargle with a salt-water mixture 3-4 times per day or as needed. To make a salt-water mixture, completely dissolve -1 tsp of salt in 1 cup of warm water.  Make sure that all household members wash their hands well.  Get plenty of rest.  Stay home from school or work until you have been taking antibiotics for 24 hours.  Keep all follow-up visits as told by your health care provider. This is important. SEEK MEDICAL CARE IF:  The glands in your neck continue to get bigger.  You develop a rash, cough, or earache.  You cough up a thick liquid that is green, yellow-brown, or bloody.  You have pain or discomfort that does not get better with medicine.  Your problems seem to be getting worse rather than better.  You have a fever. SEEK IMMEDIATE MEDICAL CARE IF:  You have new symptoms, such as vomiting, severe headache, stiff or painful neck, chest pain, or shortness of breath.  You have severe throat pain, drooling, or changes in your voice.  You have swelling of the neck, or the skin on the neck becomes red and tender.  You have signs of dehydration, such as fatigue, dry mouth, and decreased urination.  You become increasingly sleepy, or you cannot wake up completely.  Your joints become red or painful.   This information is not intended to replace  advice given to you by your health care provider. Make sure you discuss any questions you have with your health care provider.   Document Released: 09/11/2000 Document Revised: 06/05/2015 Document Reviewed: 01/07/2015 Elsevier Interactive Patient Education 2016 [employer].    IF you received an x-ray today, you will receive an invoice from [facility]. Please contact [facility] at [phone] with questions or concerns  regarding your invoice.   IF you received labwork today, you will receive an invoice from [employer]. Please contact [facility] at [phone] with questions or concerns regarding your invoice.   Our billing staff will not be able to assist you with questions regarding bills from these companies.  You will be contacted with the lab results as soon as they are available. The fastest way to get your results is to activate your My Chart account. Instructions are located on the last page of this paperwork. If you have not heard from [place] regarding the results in 2 weeks, please contact this office.

## 2017-01-02 ENCOUNTER — Ambulatory Visit: Payer: BLUE CROSS/BLUE SHIELD

## 2018-03-17 ENCOUNTER — Emergency Department (HOSPITAL_COMMUNITY)
Admission: EM | Admit: 2018-03-17 | Discharge: 2018-03-17 | Disposition: A | Discharge status: Home / Self Care | Payer: BLUE CROSS/BLUE SHIELD | Attending: Emergency Medicine | Admitting: Emergency Medicine

## 2018-03-17 ENCOUNTER — Emergency Department (HOSPITAL_COMMUNITY): Payer: BLUE CROSS/BLUE SHIELD

## 2018-03-17 ENCOUNTER — Encounter (HOSPITAL_COMMUNITY): Payer: Self-pay | Admitting: Emergency Medicine

## 2018-03-17 DIAGNOSIS — Z79899 Other long term (current) drug therapy: Secondary | ICD-10-CM | POA: Insufficient documentation

## 2018-03-17 DIAGNOSIS — E119 Type 2 diabetes mellitus without complications: Secondary | ICD-10-CM | POA: Diagnosis not present

## 2018-03-17 DIAGNOSIS — R109 Unspecified abdominal pain: Secondary | ICD-10-CM | POA: Diagnosis not present

## 2018-03-17 DIAGNOSIS — I10 Essential (primary) hypertension: Secondary | ICD-10-CM | POA: Insufficient documentation

## 2018-03-17 HISTORY — DX: Essential (primary) hypertension: I10

## 2018-03-17 LAB — URINALYSIS, ROUTINE W REFLEX MICROSCOPIC
Bilirubin Urine: NEGATIVE
GLUCOSE, UA: NEGATIVE mg/dL
Ketones, ur: NEGATIVE mg/dL
Leukocytes, UA: NEGATIVE
Nitrite: NEGATIVE
PH: 5 (ref 5.0–8.0)
Protein, ur: NEGATIVE mg/dL
Specific Gravity, Urine: 1.018 (ref 1.005–1.030)

## 2018-03-17 MED ORDER — KETOROLAC TROMETHAMINE 30 MG/ML IJ SOLN
30.0000 mg | Freq: Once | INTRAMUSCULAR | Status: AC
  Administered 2018-03-17: 30 mg via INTRAMUSCULAR
  Filled 2018-03-17: qty 1

## 2018-03-17 MED ORDER — IBUPROFEN 800 MG PO TABS: 800.0000 mg | ORAL_TABLET | Freq: Three times a day (TID) | ORAL | 0 refills | Status: DC

## 2018-03-17 MED ORDER — CYCLOBENZAPRINE HCL 10 MG PO TABS: 10.0000 mg | ORAL_TABLET | Freq: Two times a day (BID) | ORAL | 0 refills | Status: DC | PRN

## 2018-03-17 NOTE — ED Triage Notes (Signed)
Patient to ED c/o L flank pain, worsening this past week. Reports she had it in February and was given muscle relaxer - taking without relief. Patient does endorse pain worse with movement and adds new onset urinary frequency yesterday. Denies other urinary symptoms or known injury. Ambulatory with steady gait.

## 2018-03-17 NOTE — ED Provider Notes (Signed)
[facility] EMERGENCY DEPARTMENT Provider Note   CSN: [id number] Arrival date & time: 03/17/18  1941     History   Chief Complaint Chief Complaint  Patient presents with  . Flank Pain    HPI Lauren Bowen is a 57 y.o. female.  HPI   57 year old female presenting complaining of left-sided back pain.  For the past 3 weeks patient has had progressive worsening pain to her left mid back.  Pain is described as a sharp sensation occasionally wraps around her torso chests, worsening with movement, laying flat, and occasional with cough.  Pain felt like a muscle aches however it has not been improving despite using over-the-counter medication and massages.  Pain is now 8 out of 10, and brought her to tears.  No associated fever, chills, lightheadedness, dizziness, exertional chest pain, shortness of breath, hemoptysis, abdominal pain, dysuria or hematuria.  She report having similar pain earlier this year that lasted for nearly a month and subsequently improved with getting a massage.  She did report performing some new exercise recently and unsure if that may have aggravated her pain.  She does not have any history of kidney disease or kidney stone.   Past Medical History:  Diagnosis Date  . Arthritis    LEFT KNEE  . Hypertension     Patient Active Problem List   Diagnosis Date Noted  . DIABETES-TYPE 2 03/19/2010  . OBESITY 03/19/2010    Past Surgical History:  Procedure Laterality Date  . MYOMECTOMY       OB History   None      Home Medications    Prior to Admission medications   Medication Sig Start Date End Date Taking? Authorizing Provider  albuterol (PROVENTIL HFA;VENTOLIN HFA) 108 (90 BASE) MCG/ACT inhaler Inhale 2 puffs into the lungs every 4 (four) hours as needed for wheezing (cough, shortness of breath or wheezing.). Patient not taking: Reported on 01/20/2016 01/27/13   Harrison Mons, PA-C  penicillin v potassium (VEETID) 500 MG tablet Take  1 tablet (500 mg total) by mouth 3 (three) times daily. 01/20/16   Robyn Haber, MD    Family History Family History  Problem Relation Age of Onset  . Diabetes Mother   . Hypertension Mother   . Hypertension Father   . Diabetes Father   . Hypertension Sister   . Heart disease Brother   . Hypertension Sister   . Kidney disease Sister   . Diabetes Maternal Grandmother   . Diabetes Maternal Grandfather   . Asthma Maternal Grandfather   . Heart disease Paternal Grandmother     Social History Social History   Tobacco Use  . Smoking status: Never Smoker  . Smokeless tobacco: Never Used  Substance Use Topics  . Alcohol use: No  . Drug use: No     Allergies   Patient has no known allergies.   Review of Systems Review of Systems  All other systems reviewed and are negative.    Physical Exam Updated Vital Signs BP (!) 157/101 (BP Location: Left Arm)   Pulse (!) 108   Temp 99.4 F (37.4 C) (Oral)   Resp 18   SpO2 100%   Physical Exam  Constitutional: She appears well-developed and well-nourished. No distress.  Moderately obese female, appears uncomfortable with movement.  HENT:  Head: Atraumatic.  Eyes: Conjunctivae are normal.  Neck: Neck supple.  Cardiovascular: Normal rate and regular rhythm.  Pulmonary/Chest: Effort normal and breath sounds normal. No respiratory distress. She  has no wheezes. She has no rales.  Abdominal: Soft. She exhibits no distension. There is no tenderness.  Genitourinary:  Genitourinary Comments: No CVA tenderness  Musculoskeletal: She exhibits tenderness (Tenderness to left-sided mid back on palpation without overlying skin changes.  No crepitus or emphysema.).  Neurological: She is alert.  Skin: No rash noted.  Psychiatric: She has a normal mood and affect.  Nursing note and vitals reviewed.    ED Treatments / Results  Labs (all labs ordered are listed, but only abnormal results are displayed) Labs Reviewed  URINALYSIS,  ROUTINE W REFLEX MICROSCOPIC - Abnormal; Notable for the following components:      Result Value   APPearance HAZY (*)    Hgb urine dipstick SMALL (*)    Bacteria, UA RARE (*)    All other components within normal limits    EKG None  Radiology Dg Chest 2 View  Result Date: 03/17/2018 CLINICAL DATA:  Back pain for 2 weeks EXAM: CHEST - 2 VIEW COMPARISON:  None. FINDINGS: Cardiac shadow is within normal limits. Lungs are clear bilaterally. Degenerative changes of the thoracic spine are seen. IMPRESSION: No acute abnormality noted. Electronically Signed   By: Inez Catalina M.D.   On: 03/17/2018 08:46    Procedures Procedures (including critical care time)  Medications Ordered in ED Medications  ketorolac (TORADOL) 30 MG/ML injection 30 mg (has no administration in time range)     Initial Impression / Assessment and Plan / ED Course  I have reviewed the triage vital signs and the nursing notes.  Pertinent labs & imaging results that were available during my care of the patient were reviewed by me and considered in my medical decision making (see chart for details).     BP 115/61   Pulse 79   Temp 99.4 F (37.4 C) (Oral)   Resp 18   SpO2 100%    Final Clinical Impressions(s) / ED Diagnoses   Final diagnoses:  Left flank pain    ED Discharge Orders        Ordered    ibuprofen (ADVIL,MOTRIN) 800 MG tablet  3 times daily     03/17/18 1006    cyclobenzaprine (FLEXERIL) 10 MG tablet  2 times daily PRN     03/17/18 1006     8:32 AM Patient here with reproducible left-sided mid back pain likely muscle skeletal in origin.  She does endorse occasional cough therefore I will obtain a chest x-ray to rule out occult pneumonia as well as assessing for her ribs status.  Given the location, will obtain UA to assess for potential kidney stones.  Otherwise I have low suspicion for acute life-threatening emergency since patient's symptom has been ongoing for nearly a month.\  10:05  AM UA without signs of urinary tract infection.  Trace hemoglobin and urine dipstick however I will have low suspicion for kidney stone causing her symptoms.  Chest x-ray unremarkable.  At this time patient discharged home with anti-inflammatory medication as well as muscle relaxant.  She will follow-up closely with her primary care doctor for further care.  Return precautions discussed.   Domenic Moras, PA-C 03/17/18 1007    Lajean Saver, MD 03/17/18 1126

## 2018-07-26 DIAGNOSIS — Z6841 Body Mass Index (BMI) 40.0 and over, adult: Secondary | ICD-10-CM | POA: Diagnosis not present

## 2018-07-26 DIAGNOSIS — Z23 Encounter for immunization: Secondary | ICD-10-CM | POA: Diagnosis not present

## 2018-07-26 DIAGNOSIS — I10 Essential (primary) hypertension: Secondary | ICD-10-CM | POA: Diagnosis not present

## 2018-08-11 DIAGNOSIS — G8929 Other chronic pain: Secondary | ICD-10-CM | POA: Diagnosis not present

## 2018-08-11 DIAGNOSIS — I10 Essential (primary) hypertension: Secondary | ICD-10-CM | POA: Diagnosis not present

## 2018-08-11 DIAGNOSIS — M25562 Pain in left knee: Secondary | ICD-10-CM | POA: Diagnosis not present

## 2018-08-11 DIAGNOSIS — M1712 Unilateral primary osteoarthritis, left knee: Secondary | ICD-10-CM | POA: Diagnosis not present

## 2018-08-22 DIAGNOSIS — M1712 Unilateral primary osteoarthritis, left knee: Secondary | ICD-10-CM | POA: Diagnosis not present

## 2018-09-08 DIAGNOSIS — I10 Essential (primary) hypertension: Secondary | ICD-10-CM | POA: Diagnosis not present

## 2018-11-01 DIAGNOSIS — Z1231 Encounter for screening mammogram for malignant neoplasm of breast: Secondary | ICD-10-CM | POA: Diagnosis not present

## 2018-11-01 DIAGNOSIS — Z6841 Body Mass Index (BMI) 40.0 and over, adult: Secondary | ICD-10-CM | POA: Diagnosis not present

## 2018-11-01 DIAGNOSIS — Z01419 Encounter for gynecological examination (general) (routine) without abnormal findings: Secondary | ICD-10-CM | POA: Diagnosis not present

## 2018-11-30 DIAGNOSIS — I10 Essential (primary) hypertension: Secondary | ICD-10-CM | POA: Diagnosis not present

## 2018-12-07 DIAGNOSIS — I10 Essential (primary) hypertension: Secondary | ICD-10-CM | POA: Diagnosis not present

## 2018-12-07 DIAGNOSIS — Z0000 Encounter for general adult medical examination without abnormal findings: Secondary | ICD-10-CM | POA: Diagnosis not present

## 2019-02-13 DIAGNOSIS — M25511 Pain in right shoulder: Secondary | ICD-10-CM | POA: Diagnosis not present

## 2019-06-12 DIAGNOSIS — Z23 Encounter for immunization: Secondary | ICD-10-CM | POA: Diagnosis not present

## 2019-06-12 DIAGNOSIS — I10 Essential (primary) hypertension: Secondary | ICD-10-CM | POA: Diagnosis not present

## 2019-06-12 DIAGNOSIS — Z6841 Body Mass Index (BMI) 40.0 and over, adult: Secondary | ICD-10-CM | POA: Diagnosis not present

## 2019-12-05 DIAGNOSIS — Z01419 Encounter for gynecological examination (general) (routine) without abnormal findings: Secondary | ICD-10-CM | POA: Diagnosis not present

## 2019-12-05 DIAGNOSIS — Z6841 Body Mass Index (BMI) 40.0 and over, adult: Secondary | ICD-10-CM | POA: Diagnosis not present

## 2019-12-05 DIAGNOSIS — N952 Postmenopausal atrophic vaginitis: Secondary | ICD-10-CM | POA: Diagnosis not present

## 2019-12-05 DIAGNOSIS — Z1231 Encounter for screening mammogram for malignant neoplasm of breast: Secondary | ICD-10-CM | POA: Diagnosis not present

## 2019-12-07 DIAGNOSIS — Z0000 Encounter for general adult medical examination without abnormal findings: Secondary | ICD-10-CM | POA: Diagnosis not present

## 2019-12-07 DIAGNOSIS — I10 Essential (primary) hypertension: Secondary | ICD-10-CM | POA: Diagnosis not present

## 2019-12-12 DIAGNOSIS — I10 Essential (primary) hypertension: Secondary | ICD-10-CM | POA: Diagnosis not present

## 2019-12-12 DIAGNOSIS — Z0000 Encounter for general adult medical examination without abnormal findings: Secondary | ICD-10-CM | POA: Diagnosis not present

## 2020-04-16 ENCOUNTER — Encounter: Payer: Self-pay | Admitting: Gastroenterology

## 2020-05-29 ENCOUNTER — Telehealth: Payer: Self-pay

## 2020-05-29 NOTE — Telephone Encounter (Signed)
OK for direct colonoscopy at [facility].

## 2020-05-29 NOTE — Telephone Encounter (Signed)
Dr. Fuller Plan,  This patient's BMI is 63.1; would you like for this patient to have an OV or be scheduled directly for [facility] colon?  Please advise/thank you

## 2020-05-31 NOTE — Telephone Encounter (Signed)
Please contact patient and re- schedule  Colonoscopy for [name] and covid screen. Thanks!

## 2020-06-04 NOTE — Telephone Encounter (Signed)
The most recent BMI in Epic is from 2017. Has patient been seen recently that gave you a BMI of 63.1?

## 2020-06-05 NOTE — Telephone Encounter (Signed)
The PCP sent a referral for the colonoscopy and in the PCP notes her last BMI was 62 on  12/12/2019.

## 2020-06-06 ENCOUNTER — Other Ambulatory Visit: Payer: Self-pay

## 2020-06-06 DIAGNOSIS — Z1211 Encounter for screening for malignant neoplasm of colon: Secondary | ICD-10-CM

## 2020-06-06 NOTE — Telephone Encounter (Signed)
Waiting for patient to return my call. Went ahead and scheduled patient for Colonoscopy at [facility] on 08/05/20 at 7:30am arriving at 6:00am. If patient comes for pre-visit today then we can get more recent BMI and give instructions for hospital date and time.

## 2020-06-06 NOTE — Telephone Encounter (Signed)
Per Dr. Fuller Plan, patient should come in for a more recent BMI check since her last appt with her PCP was from March of this year. Called patient and left voicemail to have her call our office to discuss. Will cancel pre-visit and schedule nurse visit.

## 2020-06-06 NOTE — Telephone Encounter (Signed)
Please see Dr. Lynne Leader previous message concerning having this patient being scheduled at [facility] directly. Thanks

## 2020-06-07 ENCOUNTER — Ambulatory Visit (AMBULATORY_SURGERY_CENTER): Payer: Self-pay | Admitting: *Deleted

## 2020-06-07 ENCOUNTER — Other Ambulatory Visit: Payer: Self-pay

## 2020-06-07 VITALS — Ht 63.0 in | Wt 359.0 lb

## 2020-06-07 DIAGNOSIS — Z01818 Encounter for other preprocedural examination: Secondary | ICD-10-CM

## 2020-06-07 DIAGNOSIS — Z1211 Encounter for screening for malignant neoplasm of colon: Secondary | ICD-10-CM

## 2020-06-07 MED ORDER — PLENVU 140 G PO SOLR: 1.0000 | Freq: Once | ORAL | 0 refills | Status: AC

## 2020-06-07 NOTE — Progress Notes (Signed)
Patient is here in-person for PV. Patient denies any allergies to eggs or soy. Patient denies any problems with anesthesia/sedation. Patient denies any oxygen use at home. Patient denies taking any diet/weight loss medications or blood thinners. Patient is not being treated for MRSA or C-diff. Patient is aware of our care-partner policy and [id number] safety protocol.t.   COVID-19 vaccines completed per patient.   Prep Prescription coupon given to the patient.

## 2020-06-18 ENCOUNTER — Encounter: Payer: Self-pay | Admitting: Gastroenterology

## 2020-06-21 ENCOUNTER — Encounter: Payer: BLUE CROSS/BLUE SHIELD | Admitting: Gastroenterology

## 2020-08-01 ENCOUNTER — Other Ambulatory Visit (HOSPITAL_COMMUNITY)
Admission: RE | Admit: 2020-08-01 | Discharge: 2020-08-01 | Disposition: A | Discharge status: Home / Self Care | Payer: BC Managed Care – PPO | Source: Ambulatory Visit | Attending: Gastroenterology | Admitting: Gastroenterology

## 2020-08-01 DIAGNOSIS — Z01812 Encounter for preprocedural laboratory examination: Secondary | ICD-10-CM | POA: Diagnosis not present

## 2020-08-01 DIAGNOSIS — Z20822 Contact with and (suspected) exposure to covid-19: Secondary | ICD-10-CM | POA: Insufficient documentation

## 2020-08-01 LAB — SARS CORONAVIRUS 2 (TAT 6-24 HRS): SARS Coronavirus 2: NEGATIVE

## 2020-08-05 ENCOUNTER — Encounter (HOSPITAL_COMMUNITY): Payer: Self-pay | Admitting: Gastroenterology

## 2020-08-05 ENCOUNTER — Other Ambulatory Visit: Payer: Self-pay

## 2020-08-05 ENCOUNTER — Encounter (HOSPITAL_COMMUNITY)
Admission: RE | Disposition: A | Discharge status: Home / Self Care | Payer: Self-pay | Source: Home / Self Care | Attending: Gastroenterology

## 2020-08-05 ENCOUNTER — Ambulatory Visit (HOSPITAL_COMMUNITY): Payer: BC Managed Care – PPO | Admitting: Registered Nurse

## 2020-08-05 ENCOUNTER — Ambulatory Visit (HOSPITAL_COMMUNITY)
Admission: RE | Admit: 2020-08-05 | Discharge: 2020-08-05 | Disposition: A | Discharge status: Home / Self Care | Payer: BC Managed Care – PPO | Attending: Gastroenterology | Admitting: Gastroenterology

## 2020-08-05 DIAGNOSIS — Z825 Family history of asthma and other chronic lower respiratory diseases: Secondary | ICD-10-CM | POA: Diagnosis not present

## 2020-08-05 DIAGNOSIS — D123 Benign neoplasm of transverse colon: Secondary | ICD-10-CM | POA: Diagnosis not present

## 2020-08-05 DIAGNOSIS — K640 First degree hemorrhoids: Secondary | ICD-10-CM | POA: Diagnosis not present

## 2020-08-05 DIAGNOSIS — Z79899 Other long term (current) drug therapy: Secondary | ICD-10-CM | POA: Diagnosis not present

## 2020-08-05 DIAGNOSIS — K635 Polyp of colon: Secondary | ICD-10-CM | POA: Diagnosis not present

## 2020-08-05 DIAGNOSIS — Z833 Family history of diabetes mellitus: Secondary | ICD-10-CM | POA: Insufficient documentation

## 2020-08-05 DIAGNOSIS — Z8249 Family history of ischemic heart disease and other diseases of the circulatory system: Secondary | ICD-10-CM | POA: Insufficient documentation

## 2020-08-05 DIAGNOSIS — I10 Essential (primary) hypertension: Secondary | ICD-10-CM | POA: Insufficient documentation

## 2020-08-05 DIAGNOSIS — Z8489 Family history of other specified conditions: Secondary | ICD-10-CM | POA: Diagnosis not present

## 2020-08-05 DIAGNOSIS — Z841 Family history of disorders of kidney and ureter: Secondary | ICD-10-CM | POA: Insufficient documentation

## 2020-08-05 DIAGNOSIS — Z8371 Family history of colonic polyps: Secondary | ICD-10-CM | POA: Diagnosis not present

## 2020-08-05 DIAGNOSIS — Z1211 Encounter for screening for malignant neoplasm of colon: Secondary | ICD-10-CM

## 2020-08-05 DIAGNOSIS — Z6841 Body Mass Index (BMI) 40.0 and over, adult: Secondary | ICD-10-CM | POA: Diagnosis not present

## 2020-08-05 DIAGNOSIS — E119 Type 2 diabetes mellitus without complications: Secondary | ICD-10-CM | POA: Diagnosis not present

## 2020-08-05 HISTORY — PX: COLONOSCOPY WITH PROPOFOL: SHX5780

## 2020-08-05 HISTORY — PX: POLYPECTOMY: SHX5525

## 2020-08-05 SURGERY — COLONOSCOPY WITH PROPOFOL
Anesthesia: Monitor Anesthesia Care

## 2020-08-05 MED ORDER — PROPOFOL 500 MG/50ML IV EMUL
INTRAVENOUS | Status: DC | PRN
  Administered 2020-08-05: 100 ug/kg/min via INTRAVENOUS

## 2020-08-05 MED ORDER — LACTATED RINGERS IV SOLN
INTRAVENOUS | Status: DC
  Administered 2020-08-05: 1000 mL via INTRAVENOUS

## 2020-08-05 MED ORDER — PROPOFOL 1000 MG/100ML IV EMUL
INTRAVENOUS | Status: AC
  Filled 2020-08-05: qty 100

## 2020-08-05 MED ORDER — PROPOFOL 10 MG/ML IV BOLUS
INTRAVENOUS | Status: DC | PRN
  Administered 2020-08-05: 40 mg via INTRAVENOUS

## 2020-08-05 MED ORDER — SODIUM CHLORIDE 0.9 % IV SOLN: INTRAVENOUS | Status: DC

## 2020-08-05 MED ORDER — PROPOFOL 500 MG/50ML IV EMUL
INTRAVENOUS | Status: AC
  Filled 2020-08-05: qty 50

## 2020-08-05 MED ORDER — LIDOCAINE 2% (20 MG/ML) 5 ML SYRINGE
INTRAMUSCULAR | Status: DC | PRN
  Administered 2020-08-05: 100 mg via INTRAVENOUS

## 2020-08-05 SURGICAL SUPPLY — 22 items

## 2020-08-05 NOTE — Op Note (Signed)
[facility] Patient Name: Lauren Bowen Procedure Date: 08/05/2020 MRN: [id number] Attending MD: Ladene Artist , MD Date of Birth: [birth year] CSN: [id number] Age: 59 Admit Type: Outpatient Procedure:                Colonoscopy Indications:              Screening for colorectal malignant neoplasm Providers:                Pricilla Riffle. Fuller Plan, MD, Cleda Daub, RN, Benetta Spar, Technician, Laverda Sorenson, Technician,                            Marla Roe, CRNA Referring MD:             Deland Pretty, MD Medicines:                Monitored Anesthesia Care Complications:            No immediate complications. Estimated blood loss:                            None. Estimated Blood Loss:     Estimated blood loss: none. Procedure:                Pre-Anesthesia Assessment:                           - Prior to the procedure, a History and Physical                            was performed, and patient medications and                            allergies were reviewed. The patient's tolerance of                            previous anesthesia was also reviewed. The risks                            and benefits of the procedure and the sedation                            options and risks were discussed with the patient.                            All questions were answered, and informed consent                            was obtained. Prior Anticoagulants: The patient has                            taken no previous anticoagulant or antiplatelet                            agents. ASA  Grade Assessment: III - A patient with                            severe systemic disease. After reviewing the risks                            and benefits, the patient was deemed in                            satisfactory condition to undergo the procedure.                           After obtaining informed consent, the colonoscope                            was  passed under direct vision. Throughout the                            procedure, the patient's blood pressure, pulse, and                            oxygen saturations were monitored continuously. The                            CF-HQ190L ([id number]) Olympus colonoscope was                            introduced through the anus and advanced to the the                            cecum, identified by appendiceal orifice and                            ileocecal valve. The ileocecal valve, appendiceal                            orifice, and rectum were photographed. The quality                            of the bowel preparation was excellent. The                            colonoscopy was performed without difficulty. The                            patient tolerated the procedure well. Scope In: 7:49:32 AM Scope Out: 8:04:50 AM Scope Withdrawal Time: 0 hours 12 minutes 47 seconds  Total Procedure Duration: 0 hours 15 minutes 18 seconds  Findings:      The perianal and digital rectal examinations were normal.      Two sessile polyps were found in the transverse colon. The polyps were 6       to 8 mm in size. These polyps were removed with a cold snare. Resection       and retrieval were complete.  Internal hemorrhoids were found during retroflexion. The hemorrhoids       were small and Grade I (internal hemorrhoids that do not prolapse).      The exam was otherwise without abnormality on direct and retroflexion       views. Impression:               - Two 6 to 8 mm polyps in the transverse colon,                            removed with a cold snare. Resected and retrieved.                           - Internal hemorrhoids.                           - The examination was otherwise normal on direct                            and retroflexion views. Moderate Sedation:      Not Applicable - Patient had care per Anesthesia. Recommendation:           - Repeat colonoscopy after studies are  complete for                            surveillance based on pathology results.                           - Patient has a contact number available for                            emergencies. The signs and symptoms of potential                            delayed complications were discussed with the                            patient. Return to normal activities tomorrow.                            Written discharge instructions were provided to the                            patient.                           - Resume previous diet.                           - Continue present medications.                           - Await pathology results. Procedure Code(s):        --- Professional ---                           [phone], Colonoscopy, flexible; with removal of  tumor(s), polyp(s), or other lesion(s) by snare                            technique Diagnosis Code(s):        --- Professional ---                           Z12.11, Encounter for screening for malignant                            neoplasm of colon                           K63.5, Polyp of colon                           K64.0, First degree hemorrhoids CPT copyright 2019 American Medical Association. All rights reserved. The codes documented in this report are preliminary and upon coder review may  be revised to meet current compliance requirements. Ladene Artist, MD 08/05/2020 8:16:20 AM This report has been signed electronically. Number of Addenda: 0

## 2020-08-05 NOTE — Anesthesia Preprocedure Evaluation (Addendum)
Anesthesia Evaluation  Patient identified by MRN, date of birth, ID band Patient awake    Reviewed: Allergy & Precautions, NPO status , Patient's Chart, lab work & pertinent test results  History of Anesthesia Complications Negative for: history of anesthetic complications  Airway Mallampati: II  TM Distance: >3 FB Neck ROM: Full    Dental  (+) Teeth Intact   Pulmonary neg pulmonary ROS,    Pulmonary exam normal        Cardiovascular hypertension, Normal cardiovascular exam     Neuro/Psych negative neurological ROS  negative psych ROS   GI/Hepatic negative GI ROS, Neg liver ROS,   Endo/Other  Morbid obesity  Renal/GU negative Renal ROS  negative genitourinary   Musculoskeletal  (+) Arthritis ,   Abdominal   Peds  Hematology negative hematology ROS (+)   Anesthesia Other Findings   Reproductive/Obstetrics                           Anesthesia Physical Anesthesia Plan  ASA: III  Anesthesia Plan: MAC   Post-op Pain Management:    Induction: Intravenous  PONV Risk Score and Plan: 2 and Propofol infusion, TIVA and Treatment may vary due to age or medical condition  Airway Management Planned: Natural Airway, Nasal Cannula and Simple Face Mask  Additional Equipment: None  Intra-op Plan:   Post-operative Plan:   Informed Consent: I have reviewed the patients History and Physical, chart, labs and discussed the procedure including the risks, benefits and alternatives for the proposed anesthesia with the patient or authorized representative who has indicated his/her understanding and acceptance.       Plan Discussed with:   Anesthesia Plan Comments:        Anesthesia Quick Evaluation

## 2020-08-05 NOTE — H&P (Signed)
ADMISSION NOTE  Primary Care Physician:  Deland Pretty, MD Primary Gastroenterologist:  Barth Kirks  MD  CHIEF COMPLAINT:  Colorectal cancer screening.   HPI: Lauren Bowen is a 59 y.o. female her for CRC screening. Prior colonoscopy in Sept 2011 showed one small polyp which was granulation tissue. No GI complaints. Denies weight loss, abdominal pain, constipation, diarrhea, change in stool caliber, melena, hematochezia, nausea, vomiting, dysphagia, reflux symptoms, chest pain.   Past Medical History:  Diagnosis Date  . Arthritis    LEFT KNEE  . Hypertension     Past Surgical History:  Procedure Laterality Date  . MYOMECTOMY      Prior to Admission medications   Medication Sig Start Date End Date Taking? Authorizing Provider  acetaminophen (TYLENOL) 500 MG tablet Take 500 mg by mouth every 6 (six) hours as needed (pain.).    Yes [provider]  amLODipine (NORVASC) 10 MG tablet Take 10 mg by mouth daily. 05/14/20  Yes [provider]    Current Facility-Administered Medications  Medication Dose Route Frequency Provider Last Rate Last Admin  . 0.9 %  sodium chloride infusion   Intravenous Continuous Ladene Artist, MD      . lactated ringers infusion   Intravenous Continuous Ladene Artist, MD 10 mL/hr at 08/05/20 0726 Continued from Pre-op at 08/05/20 0726    Allergies as of 06/06/2020  . (No Known Allergies)    Family History  Problem Relation Age of Onset  . Diabetes Mother   . Hypertension Mother   . Colon polyps Mother   . Hypertension Father   . Diabetes Father   . Colon polyps Father   . Hypertension Sister   . Heart disease Brother   . Hypertension Sister   . Kidney disease Sister   . Diabetes Maternal Grandmother   . Diabetes Maternal Grandfather   . Asthma Maternal Grandfather   . Heart disease Paternal Grandmother   . Colon cancer Neg Hx   . Esophageal cancer Neg Hx   . Rectal cancer Neg Hx   . Stomach cancer Neg Hx      Social History   Socioeconomic History  . Marital status: Single    Spouse name: Not on file  . Number of children: 0  . Years of education: 4  . Highest education level: Not on file  Occupational History  . Occupation: English as a second language teacher  Tobacco Use  . Smoking status: Never Smoker  . Smokeless tobacco: Never Used  Vaping Use  . Vaping Use: Never used  Substance and Sexual Activity  . Alcohol use: No  . Drug use: No  . Sexual activity: Yes    Partners: Male    Birth control/protection: Post-menopausal  Other Topics Concern  . Not on file  Social History Narrative   Lives alone.  Long-time boyfriend.   Social Determinants of Health   Financial Resource Strain:   . Difficulty of Paying Living Expenses: Not on file  Food Insecurity:   . Worried About Charity fundraiser in the Last Year: Not on file  . Ran Out of Food in the Last Year: Not on file  Transportation Needs:   . Lack of Transportation (Medical): Not on file  . Lack of Transportation (Non-Medical): Not on file  Physical Activity:   . Days of Exercise per Week: Not on file  . Minutes of Exercise per Session: Not on file  Stress:   . Feeling of Stress : Not on file  Social Connections:   . Frequency of Communication with Friends and Family: Not on file  . Frequency of Social Gatherings with Friends and Family: Not on file  . Attends Religious Services: Not on file  . Active Member of Clubs or Organizations: Not on file  . Attends Archivist Meetings: Not on file  . Marital Status: Not on file  Intimate Partner Violence:   . Fear of Current or Ex-Partner: Not on file  . Emotionally Abused: Not on file  . Physically Abused: Not on file  . Sexually Abused: Not on file    Review of Systems:  All systems reviewed an negative except where noted in HPI.  Gen: Denies any fever, chills, sweats, anorexia, fatigue, weakness, malaise, weight loss, and sleep disorder CV: Denies chest pain, angina,  palpitations, syncope, orthopnea, PND, peripheral edema, and claudication. Resp: Denies dyspnea at rest, dyspnea with exercise, cough, sputum, wheezing, coughing up blood, and pleurisy. GI: Denies vomiting blood, jaundice, and fecal incontinence.   Denies dysphagia or odynophagia. GU : Denies urinary burning, blood in urine, urinary frequency, urinary hesitancy, nocturnal urination, and urinary incontinence. MS: Denies joint pain, limitation of movement, and swelling, stiffness, low back pain, extremity pain. Denies muscle weakness, cramps, atrophy.  Derm: Denies rash, itching, dry skin, hives, moles, warts, or unhealing ulcers.  Psych: Denies depression, anxiety, memory loss, suicidal ideation, hallucinations, paranoia, and confusion. Heme: Denies bruising, bleeding, and enlarged lymph nodes. Neuro:  Denies any headaches, dizziness, paresthesias. Endo:  Denies any problems with DM, thyroid, adrenal function.  Physical Exam: Vital signs in last 24 hours: Temp:  [98.4 F (36.9 C)] 98.4 F (36.9 C) (11/08 0710) Pulse Rate:  [92] 92 (11/08 0710) Resp:  [19] 19 (11/08 0710) BP: (183)/(96) 183/96 (11/08 0710) SpO2:  [99 %] 99 % (11/08 0710) Weight:  [158.8 kg] 158.8 kg (11/08 0710)   General:  Alert, well-developed, in NAD Head:  Normocephalic and atraumatic. Eyes:  Sclera clear, no icterus.   Conjunctiva pink. Ears:  Normal auditory acuity. Mouth:  No deformity or lesions.  Neck:  Supple; no masses . Lungs:  Clear throughout to auscultation.   No wheezes, crackles, or rhonchi. No acute distress. Heart:  Regular rate and rhythm; no murmurs. Abdomen:  Soft, nondistended, nontender. No masses, hepatomegaly. No obvious masses.  Normal bowel .    Rectal:  Deferred to colonoscopy   Msk:  Symmetrical without gross deformities.. Pulses:  Normal pulses noted. Extremities:  Without edema. Neurologic:  Alert and  oriented x4;  grossly normal neurologically. Skin:  Intact without significant  lesions or rashes. Cervical Nodes:  No significant cervical adenopathy. Psych:  Alert and cooperative. Normal mood and affect.   Impression / Plan:   1. CRC screening, average risk. Morbid obesity. BMI=63. Schedule colonoscopy at hospital. The risks (including bleeding, perforation, infection, missed lesions, medication reactions and possible hospitalization or surgery if complications occur), benefits, and alternatives to colonoscopy with possible biopsy and possible polypectomy were discussed with the patient and they consent to proceed.     LOS: 0 days   Bushra Denman T. Fuller Plan MD 08/05/2020, 7:35 AM [phone]

## 2020-08-05 NOTE — Anesthesia Procedure Notes (Signed)
Date/Time: 08/05/2020 7:44 AM Performed by: Talbot Grumbling, CRNA Oxygen Delivery Method: Simple face mask

## 2020-08-05 NOTE — Transfer of Care (Signed)
Immediate Anesthesia Transfer of Care Note  Patient: Lauren Bowen  Procedure(s) Performed: COLONOSCOPY WITH PROPOFOL (N/A ) POLYPECTOMY  Patient Location: PACU  Anesthesia Type:MAC  Level of Consciousness: sedated  Airway & Oxygen Therapy: Patient Spontanous Breathing and Patient connected to face mask oxygen  Post-op Assessment: Report given to RN and Post -op Vital signs reviewed and stable  Post vital signs: Reviewed and stable  Last Vitals:  Vitals Value Taken Time  BP    Temp    Pulse    Resp    SpO2      Last Pain:  Vitals:   08/05/20 0710  TempSrc: Oral  PainSc: 0-No pain         Complications: No complications documented.

## 2020-08-05 NOTE — Discharge Instructions (Signed)

## 2020-08-05 NOTE — Anesthesia Postprocedure Evaluation (Signed)
Anesthesia Post Note  Patient: Lauren Bowen  Procedure(s) Performed: COLONOSCOPY WITH PROPOFOL (N/A ) POLYPECTOMY     Patient location during evaluation: Endoscopy Anesthesia Type: MAC Level of consciousness: awake and alert Pain management: pain level controlled Vital Signs Assessment: post-procedure vital signs reviewed and stable Respiratory status: spontaneous breathing, nonlabored ventilation and respiratory function stable Cardiovascular status: blood pressure returned to baseline and stable Postop Assessment: no apparent nausea or vomiting Anesthetic complications: no   No complications documented.  Last Vitals:  Vitals:   08/05/20 0820 08/05/20 0830  BP: (!) 156/96 (!) 156/91  Pulse: 73 72  Resp: (!) 27 17  Temp:    SpO2: 100% 100%    Last Pain:  Vitals:   08/05/20 0830  TempSrc:   PainSc: 0-No pain                 Lidia Collum

## 2020-08-06 ENCOUNTER — Encounter: Payer: Self-pay | Admitting: Gastroenterology

## 2020-08-06 ENCOUNTER — Encounter (HOSPITAL_COMMUNITY): Payer: Self-pay | Admitting: Gastroenterology

## 2020-08-06 LAB — SURGICAL PATHOLOGY

## 2020-09-30 DIAGNOSIS — Z20822 Contact with and (suspected) exposure to covid-19: Secondary | ICD-10-CM | POA: Diagnosis not present

## 2020-12-25 DIAGNOSIS — R7989 Other specified abnormal findings of blood chemistry: Secondary | ICD-10-CM | POA: Diagnosis not present

## 2020-12-25 DIAGNOSIS — I10 Essential (primary) hypertension: Secondary | ICD-10-CM | POA: Diagnosis not present

## 2020-12-25 DIAGNOSIS — Z0000 Encounter for general adult medical examination without abnormal findings: Secondary | ICD-10-CM | POA: Diagnosis not present

## 2021-01-01 ENCOUNTER — Other Ambulatory Visit: Payer: Self-pay | Admitting: Internal Medicine

## 2021-01-01 DIAGNOSIS — I10 Essential (primary) hypertension: Secondary | ICD-10-CM

## 2021-01-01 DIAGNOSIS — R748 Abnormal levels of other serum enzymes: Secondary | ICD-10-CM | POA: Diagnosis not present

## 2021-01-01 DIAGNOSIS — Z0000 Encounter for general adult medical examination without abnormal findings: Secondary | ICD-10-CM | POA: Diagnosis not present

## 2021-01-01 DIAGNOSIS — R7989 Other specified abnormal findings of blood chemistry: Secondary | ICD-10-CM | POA: Diagnosis not present

## 2021-01-02 ENCOUNTER — Other Ambulatory Visit: Payer: Self-pay | Admitting: Internal Medicine

## 2021-01-02 DIAGNOSIS — E010 Iodine-deficiency related diffuse (endemic) goiter: Secondary | ICD-10-CM

## 2021-01-06 DIAGNOSIS — R748 Abnormal levels of other serum enzymes: Secondary | ICD-10-CM | POA: Diagnosis not present

## 2021-01-23 ENCOUNTER — Ambulatory Visit
Admission: RE | Admit: 2021-01-23 | Discharge: 2021-01-23 | Disposition: A | Discharge status: Home / Self Care | Payer: BC Managed Care – PPO | Source: Ambulatory Visit | Attending: Internal Medicine | Admitting: Internal Medicine

## 2021-01-23 DIAGNOSIS — E010 Iodine-deficiency related diffuse (endemic) goiter: Secondary | ICD-10-CM | POA: Diagnosis not present

## 2021-02-10 ENCOUNTER — Ambulatory Visit
Admission: RE | Admit: 2021-02-10 | Discharge: 2021-02-10 | Disposition: A | Discharge status: Home / Self Care | Payer: BC Managed Care – PPO | Source: Ambulatory Visit | Attending: Internal Medicine | Admitting: Internal Medicine

## 2021-02-10 DIAGNOSIS — I10 Essential (primary) hypertension: Secondary | ICD-10-CM

## 2021-02-13 DIAGNOSIS — E0590 Thyrotoxicosis, unspecified without thyrotoxic crisis or storm: Secondary | ICD-10-CM | POA: Diagnosis not present

## 2021-02-13 DIAGNOSIS — R7303 Prediabetes: Secondary | ICD-10-CM | POA: Diagnosis not present

## 2021-02-13 DIAGNOSIS — E042 Nontoxic multinodular goiter: Secondary | ICD-10-CM | POA: Diagnosis not present

## 2021-02-13 DIAGNOSIS — I10 Essential (primary) hypertension: Secondary | ICD-10-CM | POA: Diagnosis not present

## 2021-02-14 ENCOUNTER — Other Ambulatory Visit: Payer: Self-pay | Admitting: Internal Medicine

## 2021-02-14 ENCOUNTER — Other Ambulatory Visit (HOSPITAL_COMMUNITY): Payer: Self-pay | Admitting: Internal Medicine

## 2021-02-14 DIAGNOSIS — E042 Nontoxic multinodular goiter: Secondary | ICD-10-CM

## 2021-02-14 DIAGNOSIS — E0590 Thyrotoxicosis, unspecified without thyrotoxic crisis or storm: Secondary | ICD-10-CM

## 2021-02-20 DIAGNOSIS — Z01419 Encounter for gynecological examination (general) (routine) without abnormal findings: Secondary | ICD-10-CM | POA: Diagnosis not present

## 2021-02-20 DIAGNOSIS — Z6841 Body Mass Index (BMI) 40.0 and over, adult: Secondary | ICD-10-CM | POA: Diagnosis not present

## 2021-02-20 DIAGNOSIS — Z1231 Encounter for screening mammogram for malignant neoplasm of breast: Secondary | ICD-10-CM | POA: Diagnosis not present

## 2021-03-04 ENCOUNTER — Encounter (HOSPITAL_COMMUNITY)
Admission: RE | Admit: 2021-03-04 | Discharge: 2021-03-04 | Disposition: A | Discharge status: Home / Self Care | Payer: BC Managed Care – PPO | Source: Ambulatory Visit | Attending: Internal Medicine | Admitting: Internal Medicine

## 2021-03-04 ENCOUNTER — Other Ambulatory Visit: Payer: Self-pay

## 2021-03-04 DIAGNOSIS — E042 Nontoxic multinodular goiter: Secondary | ICD-10-CM | POA: Insufficient documentation

## 2021-03-04 DIAGNOSIS — E0590 Thyrotoxicosis, unspecified without thyrotoxic crisis or storm: Secondary | ICD-10-CM | POA: Diagnosis not present

## 2021-03-04 MED ORDER — SODIUM IODIDE I-123 7.4 MBQ CAPS
404.0000 | ORAL_CAPSULE | Freq: Once | ORAL | Status: AC
  Administered 2021-03-04: 404 via ORAL

## 2021-03-05 ENCOUNTER — Encounter (HOSPITAL_COMMUNITY)
Admission: RE | Admit: 2021-03-05 | Discharge: 2021-03-05 | Disposition: A | Discharge status: Home / Self Care | Payer: BC Managed Care – PPO | Source: Ambulatory Visit | Attending: Internal Medicine | Admitting: Internal Medicine

## 2021-03-05 DIAGNOSIS — E042 Nontoxic multinodular goiter: Secondary | ICD-10-CM | POA: Diagnosis not present

## 2021-03-05 DIAGNOSIS — E0590 Thyrotoxicosis, unspecified without thyrotoxic crisis or storm: Secondary | ICD-10-CM | POA: Diagnosis not present

## 2021-03-13 DIAGNOSIS — R7303 Prediabetes: Secondary | ICD-10-CM | POA: Diagnosis not present

## 2021-03-13 DIAGNOSIS — E0590 Thyrotoxicosis, unspecified without thyrotoxic crisis or storm: Secondary | ICD-10-CM | POA: Diagnosis not present

## 2021-03-13 DIAGNOSIS — E042 Nontoxic multinodular goiter: Secondary | ICD-10-CM | POA: Diagnosis not present

## 2021-03-13 DIAGNOSIS — I10 Essential (primary) hypertension: Secondary | ICD-10-CM | POA: Diagnosis not present

## 2021-03-20 ENCOUNTER — Other Ambulatory Visit: Payer: Self-pay | Admitting: Internal Medicine

## 2021-03-20 DIAGNOSIS — E042 Nontoxic multinodular goiter: Secondary | ICD-10-CM

## 2021-03-26 ENCOUNTER — Other Ambulatory Visit: Payer: No Typology Code available for payment source

## 2021-03-27 ENCOUNTER — Ambulatory Visit
Admission: RE | Admit: 2021-03-27 | Discharge: 2021-03-27 | Disposition: A | Discharge status: Home / Self Care | Payer: BC Managed Care – PPO | Source: Ambulatory Visit | Attending: Internal Medicine | Admitting: Internal Medicine

## 2021-03-27 ENCOUNTER — Other Ambulatory Visit (HOSPITAL_COMMUNITY)
Admission: RE | Admit: 2021-03-27 | Discharge: 2021-03-27 | Disposition: A | Discharge status: Home / Self Care | Payer: BC Managed Care – PPO | Source: Ambulatory Visit | Attending: Radiology | Admitting: Radiology

## 2021-03-27 DIAGNOSIS — E042 Nontoxic multinodular goiter: Secondary | ICD-10-CM

## 2021-03-27 DIAGNOSIS — E0789 Other specified disorders of thyroid: Secondary | ICD-10-CM | POA: Diagnosis not present

## 2021-03-27 DIAGNOSIS — R896 Abnormal cytological findings in specimens from other organs, systems and tissues: Secondary | ICD-10-CM | POA: Diagnosis not present

## 2021-03-28 LAB — CYTOLOGY - NON PAP

## 2021-04-04 DIAGNOSIS — E042 Nontoxic multinodular goiter: Secondary | ICD-10-CM | POA: Diagnosis not present

## 2021-04-16 ENCOUNTER — Encounter (HOSPITAL_COMMUNITY): Payer: Self-pay

## 2021-05-14 DIAGNOSIS — E0590 Thyrotoxicosis, unspecified without thyrotoxic crisis or storm: Secondary | ICD-10-CM | POA: Diagnosis not present

## 2021-05-21 DIAGNOSIS — E042 Nontoxic multinodular goiter: Secondary | ICD-10-CM | POA: Diagnosis not present

## 2021-05-21 DIAGNOSIS — E0590 Thyrotoxicosis, unspecified without thyrotoxic crisis or storm: Secondary | ICD-10-CM | POA: Diagnosis not present

## 2021-05-21 DIAGNOSIS — I10 Essential (primary) hypertension: Secondary | ICD-10-CM | POA: Diagnosis not present

## 2021-05-21 DIAGNOSIS — R7303 Prediabetes: Secondary | ICD-10-CM | POA: Diagnosis not present

## 2021-07-03 DIAGNOSIS — E669 Obesity, unspecified: Secondary | ICD-10-CM | POA: Diagnosis not present

## 2021-07-03 DIAGNOSIS — I10 Essential (primary) hypertension: Secondary | ICD-10-CM | POA: Diagnosis not present

## 2021-07-03 DIAGNOSIS — Z23 Encounter for immunization: Secondary | ICD-10-CM | POA: Diagnosis not present

## 2021-07-03 DIAGNOSIS — E0590 Thyrotoxicosis, unspecified without thyrotoxic crisis or storm: Secondary | ICD-10-CM | POA: Diagnosis not present

## 2021-07-07 DIAGNOSIS — I10 Essential (primary) hypertension: Secondary | ICD-10-CM | POA: Diagnosis not present

## 2021-07-07 DIAGNOSIS — E0590 Thyrotoxicosis, unspecified without thyrotoxic crisis or storm: Secondary | ICD-10-CM | POA: Diagnosis not present

## 2021-07-07 DIAGNOSIS — R7303 Prediabetes: Secondary | ICD-10-CM | POA: Diagnosis not present

## 2021-07-07 DIAGNOSIS — E042 Nontoxic multinodular goiter: Secondary | ICD-10-CM | POA: Diagnosis not present

## 2021-08-06 ENCOUNTER — Other Ambulatory Visit (HOSPITAL_COMMUNITY): Payer: Self-pay | Admitting: Internal Medicine

## 2021-08-06 DIAGNOSIS — E042 Nontoxic multinodular goiter: Secondary | ICD-10-CM

## 2021-08-06 DIAGNOSIS — E0590 Thyrotoxicosis, unspecified without thyrotoxic crisis or storm: Secondary | ICD-10-CM

## 2021-08-06 NOTE — Written Directive (Addendum)
MOLECULAR IMAGING AND THERAPEUTICS WRITTEN DIRECTIVE   PATIENT NAME: Lauren Bowen  PT DOB:   07/31/1961                                              MRN: [id number]  ---------------------------------------------------------------------------------------------------------------------   I-131 WHOLE THYROID THERAPY (NON-CANCER)    RADIOPHARMACEUTICAL:   Iodine-131 Capsule    PRESCRIBED DOSE FOR ADMINISTRATION: 30 mCI   ROUTE OFADMINISTRATION: PO   DIAGNOSIS:  Toxic multinodular goiter   REFERRING PHYSICIAN:   TSH:    = 0.09   PRIOR I-131 THERAPY (Date and Dose):   PRIOR RADIOLOGY EXAMS (Results and Date): NM THYROID MULT UPTAKE W/IMAGING  Result Date: 03/05/2021 CLINICAL DATA:  Subclinical hyperthyroidism, TSH 0.09 EXAM: THYROID SCAN AND UPTAKE - 4 AND 24 HOURS TECHNIQUE: Following oral administration of I-123 capsule, anterior planar imaging was acquired at 24 hours. Thyroid uptake was calculated with a thyroid probe at 4-6 hours and 24 hours. RADIOPHARMACEUTICALS:  404 uCi I-123 sodium iodide p.o. COMPARISON:  None Correlation: Thyroid ultrasound 01/23/2021 FINDINGS: Multinodular appearance of thyroid gland with warm and cold nodules throughout both lobes. This appearance corresponds with that seen on the recent thyroid ultrasound. 4 hour I-123 uptake = 13.9% (normal 5-20%) 24 hour I-123 uptake = 31.7% (normal 10-30%) IMPRESSION: Multinodular thyroid gland with elevated 24 hour radio iodine uptake. Findings consistent with toxic multinodular goiter. Electronically Signed   By: Lavonia Dana M.D.   On: 03/05/2021 14:41   US THYROID  Result Date: 01/23/2021 CLINICAL DATA:  Palpable abnormality. Thyromegaly on physical examination. EXAM: THYROID ULTRASOUND TECHNIQUE: Ultrasound examination of the thyroid gland and adjacent soft tissues was performed. COMPARISON:  None. FINDINGS: Parenchymal Echotexture: Moderately heterogenous Isthmus: Normal in size measuring 0.5 cm in diameter  Right lobe: Enlarged measuring 6.7 x 2.3 x 3.7 cm Left lobe: Enlarged measuring 7.3 x 2.1 x 3.1 cm _________________________________________________________ Estimated total number of nodules >/= 1 cm: 6-10 Number of spongiform nodules >/=  2 cm not described below (TR1): 0 Number of mixed cystic and solid nodules >/= 1.5 cm not described below (TR2): 0 _________________________________________________________ Nodule # 1: Location: Isthmus; Superior Maximum size: 1.4 cm; Other 2 dimensions: 1.3 x 0.6 cm Composition: solid/almost completely solid (2) Echogenicity: hypoechoic (2) Shape: not taller-than-wide (0) Margins: smooth (0) Echogenic foci: none (0) ACR TI-RADS total points: 4. ACR TI-RADS risk category: TR4 (4-6 points). ACR TI-RADS recommendations: *Given size (>/= 1 - 1.4 cm) and appearance, a follow-up ultrasound in 1 year should be considered based on TI-RADS criteria. _________________________________________________________ There is a punctate (approximately 0.9 cm) hypoechoic nodule in the superior pole the right lobe of the thyroid (labeled 2), which does not meet criteria to recommend percutaneous sampling or continued dedicated follow-up. _________________________________________________________ Nodule # 3: Location: Right; Mid Maximum size: 2.0 cm; Other 2 dimensions: 1.5 x 1.1 cm Composition: solid/almost completely solid (2) Echogenicity: hypoechoic (2) Shape: not taller-than-wide (0) Margins: smooth (0) Echogenic foci: none (0) ACR TI-RADS total points: 4. ACR TI-RADS risk category: TR4 (4-6 points). ACR TI-RADS recommendations: **Given size (>/= 1.5 cm) and appearance, fine needle aspiration of this moderately suspicious nodule should be considered based on TI-RADS criteria. _________________________________________________________ Nodule # 4: Location: Right; Mid Maximum size: 3.2 cm; Other 2 dimensions: 2.4 x 1.6 cm Composition: solid/almost completely solid (2) Echogenicity: hypoechoic (2)  Shape: not taller-than-wide (0) Margins: smooth (0)  Echogenic foci: none (0) ACR TI-RADS total points: 4. ACR TI-RADS risk category: TR4 (4-6 points). ACR TI-RADS recommendations: **Given size (>/= 1.5 cm) and appearance, fine needle aspiration of this moderately suspicious nodule should be considered based on TI-RADS criteria. _________________________________________________________ There is an approximately 1.0 cm spongiform/benign-appearing nodule within the inferior pole the right lobe of the thyroid (labeled 5), which does not meet criteria to recommend percutaneous sampling or continued dedicated follow-up. _________________________________________________________ Nodule # 6: Location: Right; Inferior Maximum size: 1.3 cm; Other 2 dimensions: 1.0 x 0.8 cm Composition: solid/almost completely solid (2) Echogenicity: hypoechoic (2) Shape: not taller-than-wide (0) Margins: smooth (0) Echogenic foci: none (0) ACR TI-RADS total points: 4. ACR TI-RADS risk category: TR4 (4-6 points). ACR TI-RADS recommendations: *Given size (>/= 1 - 1.4 cm) and appearance, a follow-up ultrasound in 1 year should be considered based on TI-RADS criteria. _________________________________________________________ Nodule # 7: Location: Right; Inferior Maximum size: 1.3 cm; Other 2 dimensions: 1.0 x 0.8 cm Composition: solid/almost completely solid (2) Echogenicity: hypoechoic (2) Shape: not taller-than-wide (0) Margins: smooth (0) Echogenic foci: none (0) ACR TI-RADS total points: 4. ACR TI-RADS risk category: TR4 (4-6 points). ACR TI-RADS recommendations: *Given size (>/= 1 - 1.4 cm) and appearance, a follow-up ultrasound in 1 year should be considered based on TI-RADS criteria. _________________________________________________________ There is an approximately 0.9 cm hypoechoic nodule within the superior pole of the left lobe of the thyroid labeled 8), which does not meet criteria to recommend percutaneous sampling or continued  dedicated follow-up There is an approximately 0.8 cm spongiform/benign-appearing nodule within the superior pole the left lobe of the thyroid (labeled 9), which does not meet criteria to recommend percutaneous sampling or continued dedicated follow-up. _________________________________________________________ Nodule # 10: Location: Left; Mid Maximum size: 1.1 cm; Other 2 dimensions: 0.7 x 0.2 cm Composition: solid/almost completely solid (2) Echogenicity: hypoechoic (2) Shape: not taller-than-wide (0) Margins: smooth (0) Echogenic foci: none (0) ACR TI-RADS total points: 4. ACR TI-RADS risk category: TR4 (4-6 points). ACR TI-RADS recommendations: *Given size (>/= 1 - 1.4 cm) and appearance, a follow-up ultrasound in 1 year should be considered based on TI-RADS criteria. _________________________________________________________ Nodule # 11: Location: Left; Inferior Maximum size: 1.9 cm; Other 2 dimensions: 1.8 x 1.0 cm Composition: solid/almost completely solid (2) Echogenicity: hypoechoic (2) Shape: not taller-than-wide (0) Margins: smooth (0) Echogenic foci: none (0) ACR TI-RADS total points: 4. ACR TI-RADS risk category: TR4 (4-6 points). ACR TI-RADS recommendations: **Given size (>/= 1.5 cm) and appearance, fine needle aspiration of this moderately suspicious nodule should be considered based on TI-RADS criteria. _________________________________________________________ There is an approximately 2.2 x 1.9 x 1.5 cm partially cystic, partially solid nodule within the inferior pole the left lobe of the thyroid (labeled 12), which does not meet criteria to recommend percutaneous sampling or continued dedicated follow-up Note is made of mildly prominent though benign appearing bilateral cervical lymph nodes with index right cervical lymph node measuring 1 cm greatest short axis diameter and maintaining a benign fatty hilum (image 108), presumably reactive in etiology. IMPRESSION: 1. Thyromegaly with findings  suggestive of multinodular goiter. 2. Nodules #3, #4 and #11 all meet imaging criteria to recommend percutaneous sampling. Current imaging recommendations are to only biopsy the two most worrisome nodules at a given time. As such nodules #3 and #4 could both undergo ultrasound-guided biopsy as indicated. 3. Nodules #1, #6, #7 and #10 all meet imaging criteria to recommend a 1 year follow-up. The above is in keeping with the ACR TI-RADS recommendations -  [place] 1552;[id number]. Electronically Signed   By: Sandi Mariscal M.D.   On: 01/23/2021 16:19      ADDITIONAL PHYSICIAN COMMENTS/NOTES  Toxic multinodular goiter. AUTHORIZED USER SIGNATURE & TIME STAMP:John Melba Coon, MD   08/06/21    1:46 PM

## 2021-08-15 ENCOUNTER — Ambulatory Visit (HOSPITAL_COMMUNITY)
Admission: RE | Admit: 2021-08-15 | Discharge: 2021-08-15 | Disposition: A | Discharge status: Home / Self Care | Payer: BC Managed Care – PPO | Source: Ambulatory Visit | Attending: Internal Medicine | Admitting: Internal Medicine

## 2021-08-15 ENCOUNTER — Other Ambulatory Visit: Payer: Self-pay

## 2021-08-15 DIAGNOSIS — E0590 Thyrotoxicosis, unspecified without thyrotoxic crisis or storm: Secondary | ICD-10-CM | POA: Insufficient documentation

## 2021-08-15 DIAGNOSIS — E042 Nontoxic multinodular goiter: Secondary | ICD-10-CM | POA: Insufficient documentation

## 2021-08-15 MED ORDER — SODIUM IODIDE I 131 CAPSULE: 29.6000 | Freq: Once | INTRAVENOUS | Status: DC | PRN

## 2021-08-17 IMAGING — CT CT CARDIAC CORONARY ARTERY CALCIUM SCORE
3 series · 14 of 20 positions shown, 16 images · non-contrast
Comparison: No priors.

CLINICAL DATA: 59-year-old African American female with history of
hypertension and family history of heart disease.

EXAM:
CT CARDIAC CORONARY ARTERY CALCIUM SCORE
TECHNIQUE: Non-contrast imaging through the heart was performed using
prospective ECG gating. Image post processing was performed on an
independent workstation, allowing for quantitative analysis of the
heart and coronary arteries. Note that this exam targets the heart
and the chest was not imaged in its entirety.

[Series 2: calcium scoring 2.00 qr36 bestdiast 71% hrt calciu · axial · 0.45mm/px · z∈[+1739,+1811]mm · 4 of 60 slices shown]
[im 12/60  vessel]
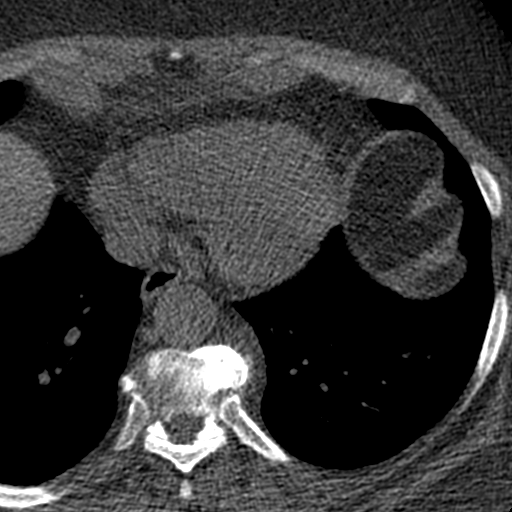
[im 24/60  vessel]
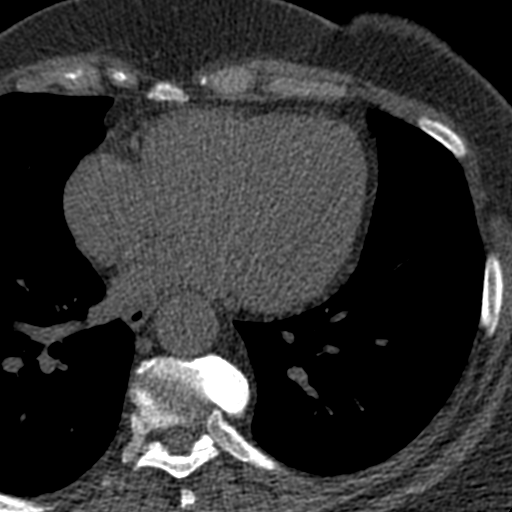
[im 36/60  vessel]
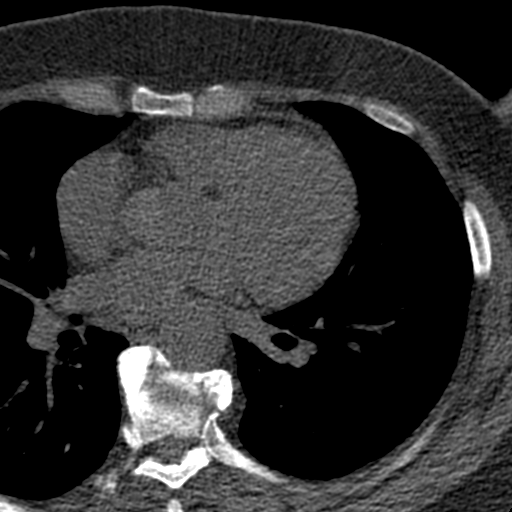
[im 48/60  vessel]
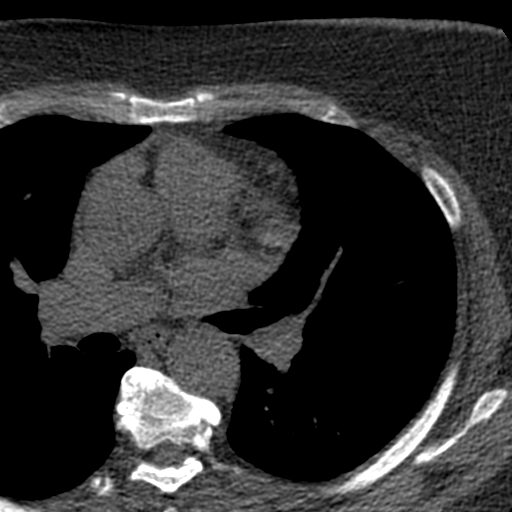

[Series 3: calcium scoring 2.00 br40 bestdiast 71% axial · axial · 0.66mm/px · z∈[+1735,+1815]mm · 5 of 60 slices shown, 7 images]
[im 10/60  vessel]
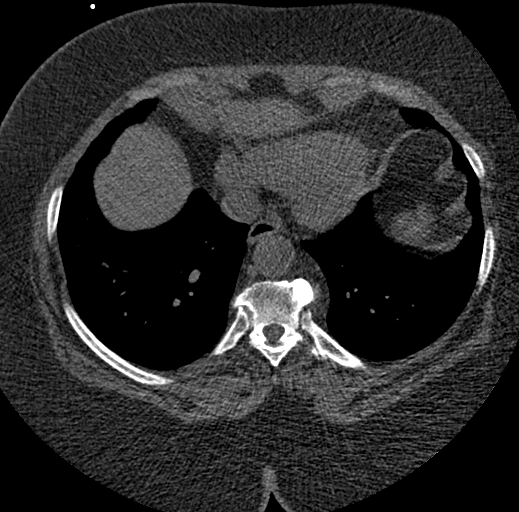
[im 10/60  lung]
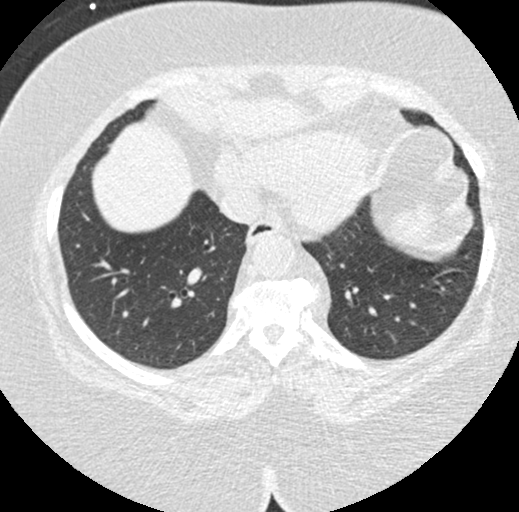
[im 20/60  vessel]
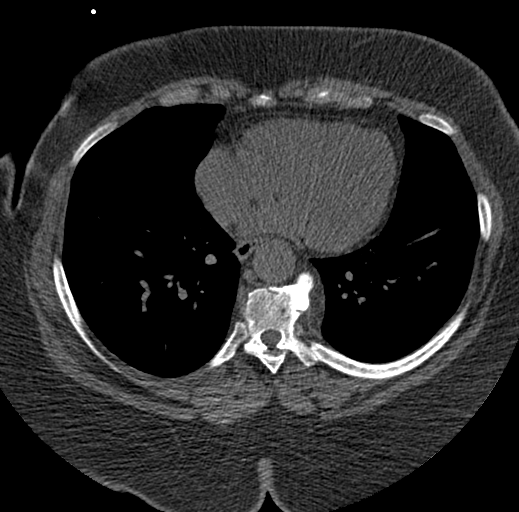
[im 30/60  vessel]
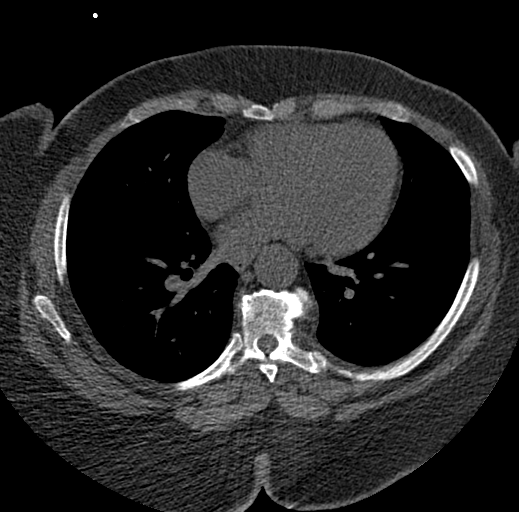
[im 40/60  vessel]
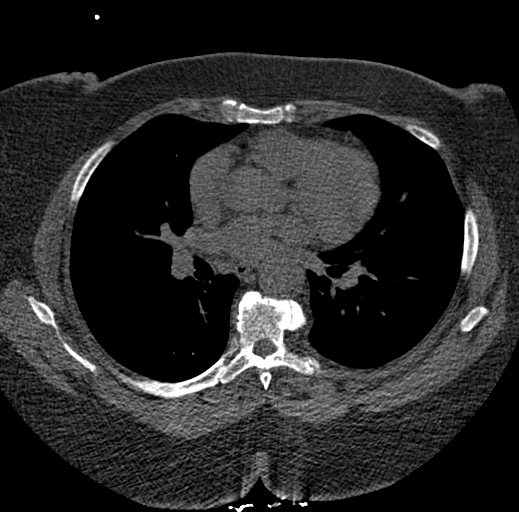
[im 50/60  vessel]
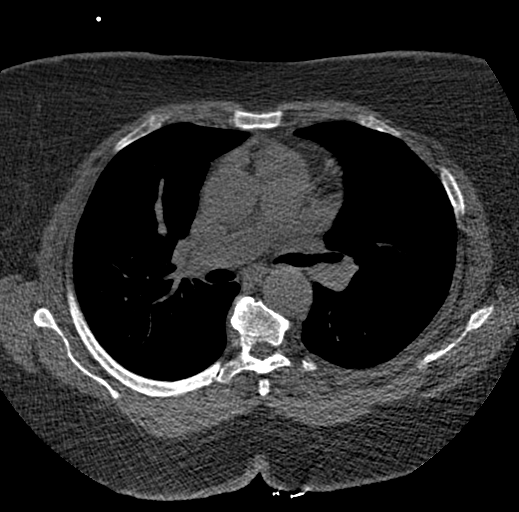
[im 50/60  lung]
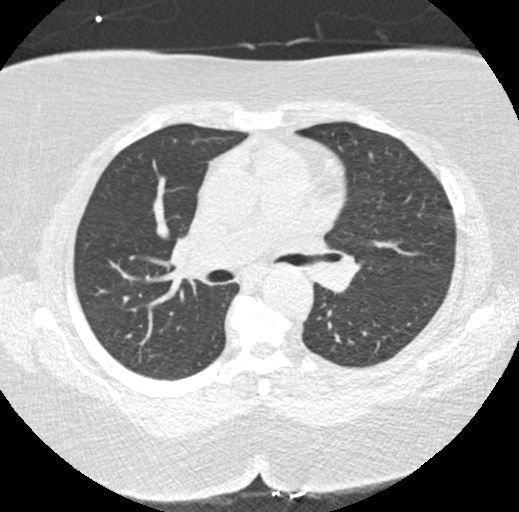

[Series 9: calcium scoring 2.00 br60 bestdiast 71% lungs · axial · 0.66mm/px · z∈[+1735,+1815]mm · 5 of 60 slices shown]
[im 10/60  vessel]
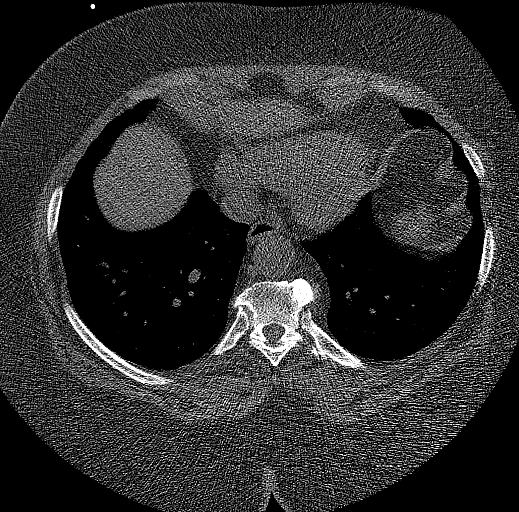
[im 20/60  vessel]
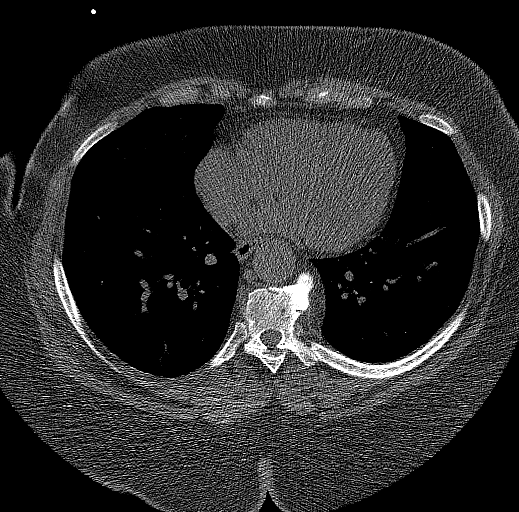
[im 30/60  vessel]
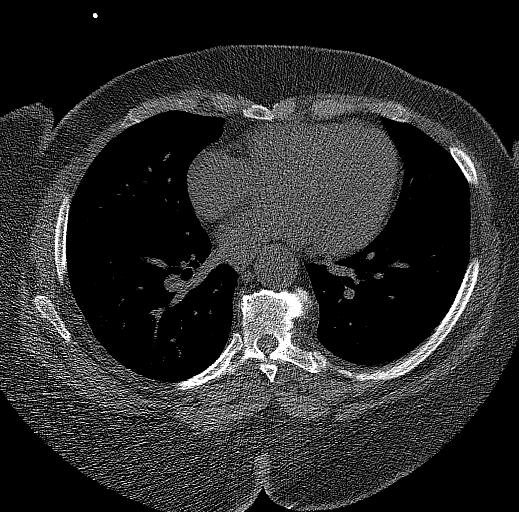
[im 40/60  vessel]
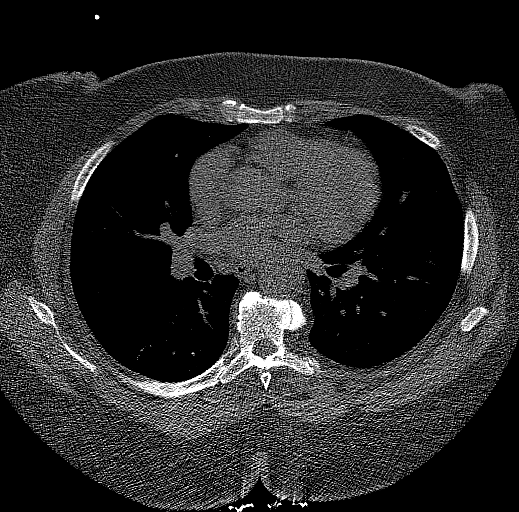
[im 50/60  vessel]
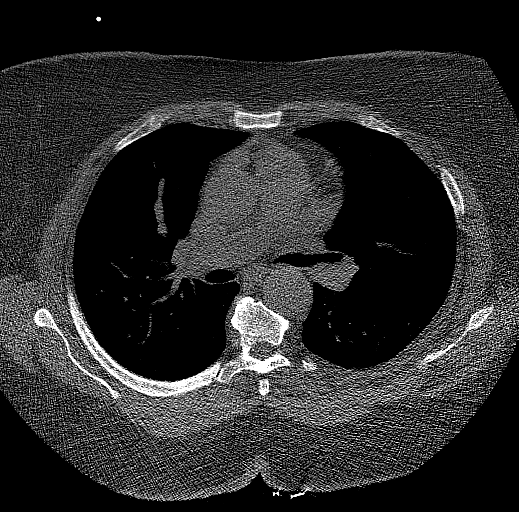

[14 of 20 positions shown; findings below may reference images not displayed]

FINDINGS: CORONARY CALCIUM SCORES:

Left Main: 0

LAD: 0

LCx: 0

RCA: 0

Total Agatston Score: 0

[HOSPITAL] percentile: N/A

AORTA MEASUREMENTS:

Ascending Aorta: 36 mm

Descending Aorta: 28 mm

EXTRACARDIAC FINDINGS:

Within the visualized portions of the thorax there are no suspicious
appearing pulmonary nodules or masses, there is no acute
consolidative airspace disease, no pleural effusions, no
pneumothorax and no lymphadenopathy. Visualized portions of the
upper abdomen are unremarkable. There are no aggressive appearing
lytic or blastic lesions noted in the visualized portions of the
skeleton.
IMPRESSION: 1. Patient's total coronary artery calcium score is 0 which
indicates a very low (but nonzero) risk of major adverse
cardiovascular events over the next 10 years.
2. No significant incidental noncardiac findings are noted.

## 2021-09-23 DIAGNOSIS — E0590 Thyrotoxicosis, unspecified without thyrotoxic crisis or storm: Secondary | ICD-10-CM | POA: Diagnosis not present

## 2021-09-23 DIAGNOSIS — R7303 Prediabetes: Secondary | ICD-10-CM | POA: Diagnosis not present

## 2021-09-23 DIAGNOSIS — E042 Nontoxic multinodular goiter: Secondary | ICD-10-CM | POA: Diagnosis not present

## 2021-09-23 DIAGNOSIS — I10 Essential (primary) hypertension: Secondary | ICD-10-CM | POA: Diagnosis not present

## 2021-10-01 IMAGING — US US FNA BIOPSY THYROID 1ST LESION
1 series · 13 of 25 positions shown · non-contrast
Comparison: Thyroid ultrasound dated 01/23/2021

INDICATION: Patient with history of thyromegaly on physical exam and thyroid
ultrasound on 01/23/2021 which revealed multinodular goiter with 2
cm and 3.2 cm right mid thyroid nodules which meet criteria for
biopsy. She presents today for the procedure.

EXAM:
ULTRASOUND GUIDED FINE NEEDLE ASPIRATION BIOPSIES OF RIGHT
LATERAL/MID AND RIGHT MID THYROID NODULES
TECHNIQUE: Informed written consent was obtained from the patient after a
discussion of the risks, benefits and alternatives to treatment.
Questions regarding the procedure were encouraged and answered. A
timeout was performed prior to the initiation of the procedure.

[Series 1: us fna biopsy thyroid 1st lesion · 0.06mm/px · 25 acquisitions, 13 frames shown]
[im 1/25]
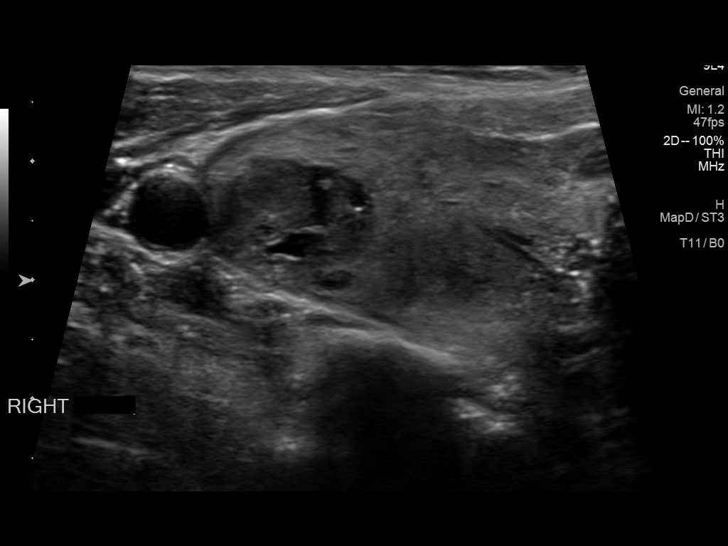
[im 3/25]
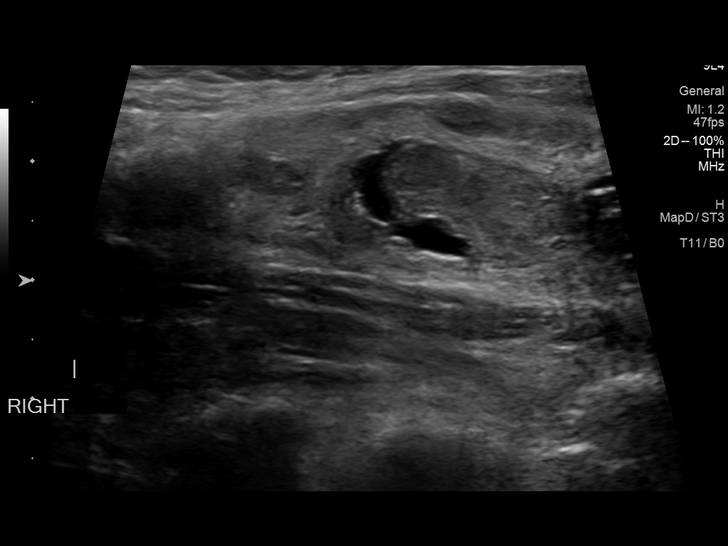
[im 5/25]
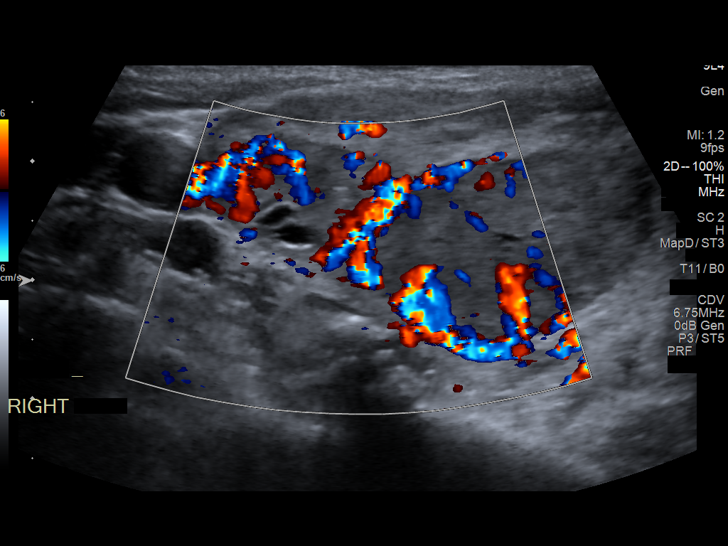
[im 7/25]
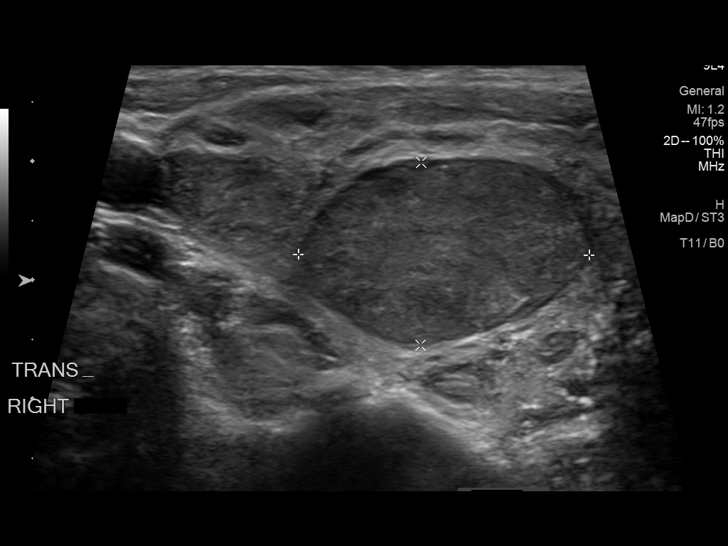
[im 9/25]
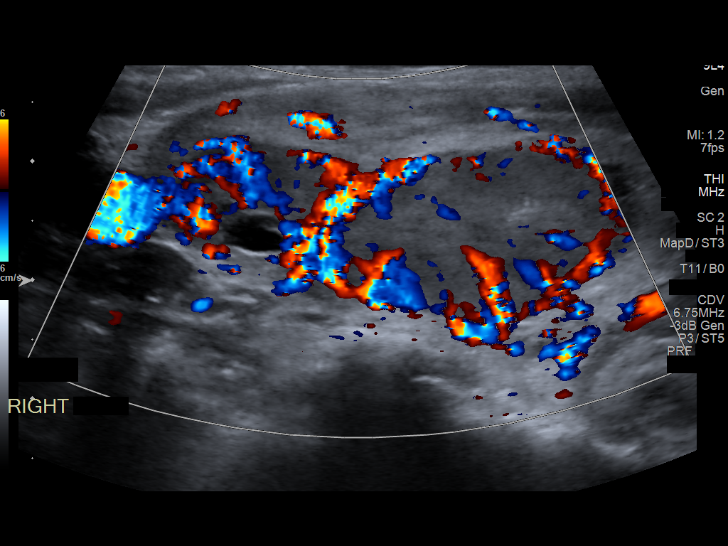
[im 11/25]
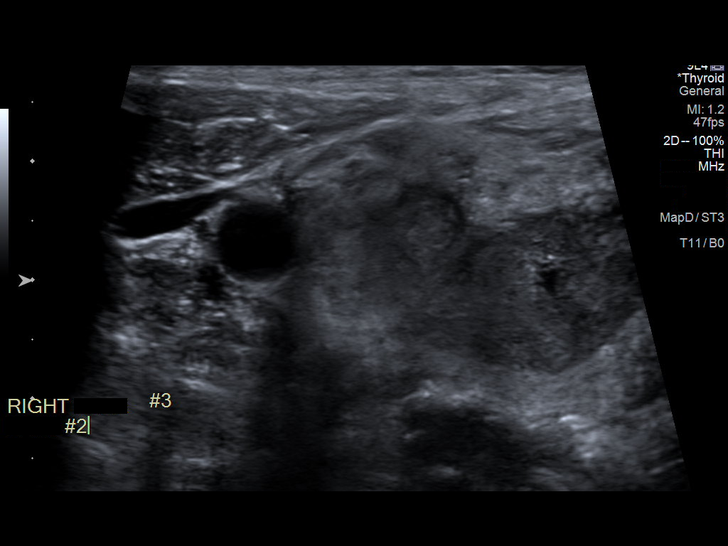
[im 13/25]
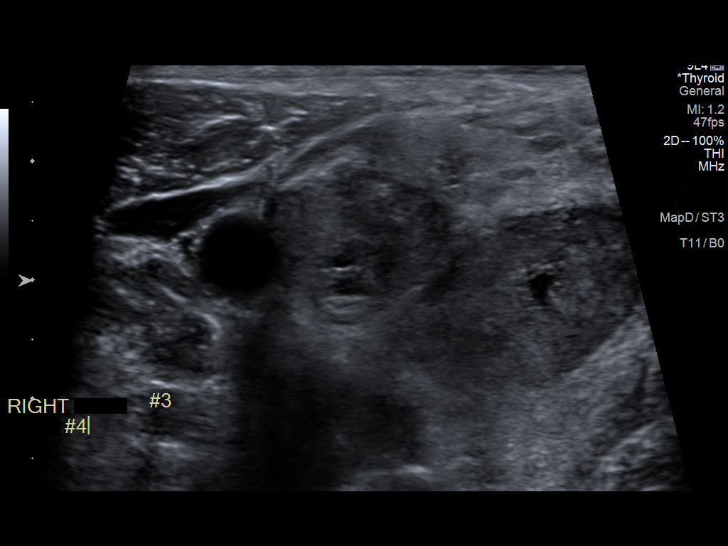
[im 15/25]
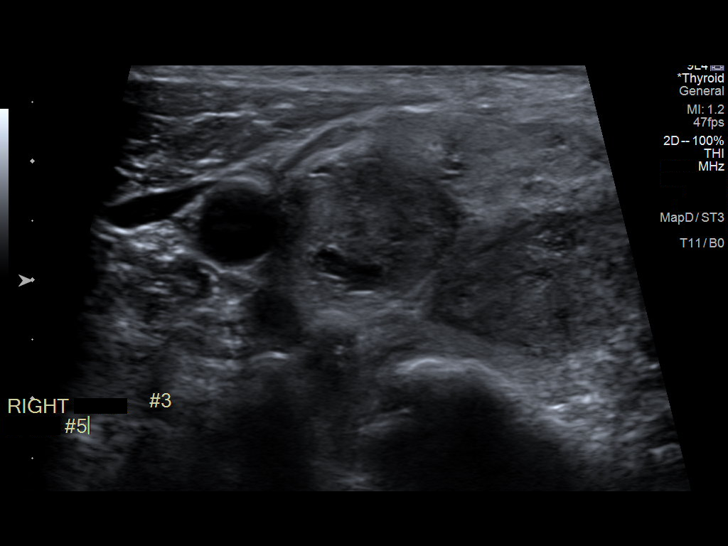
[im 17/25]
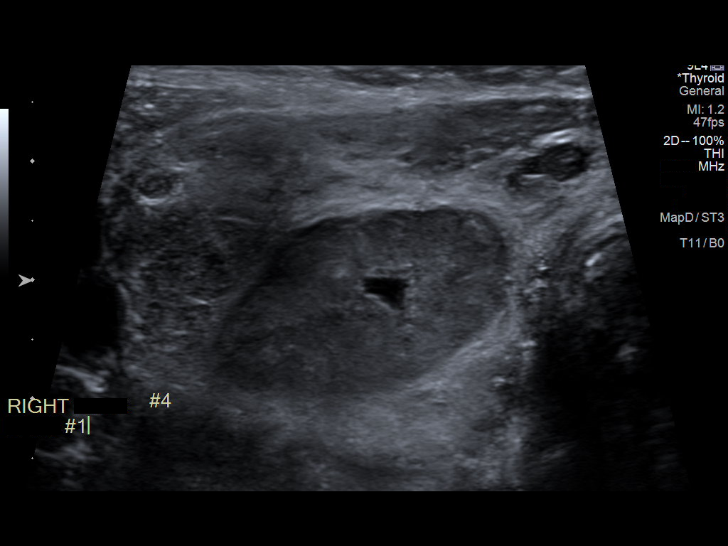
[im 19/25]
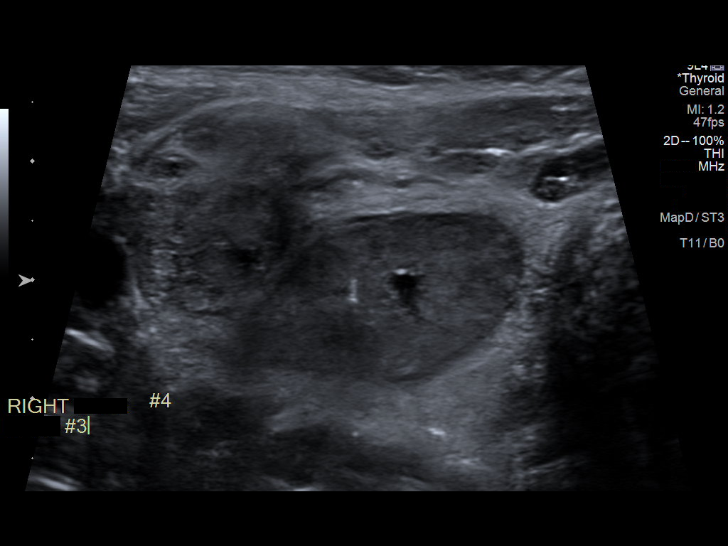
[im 21/25]
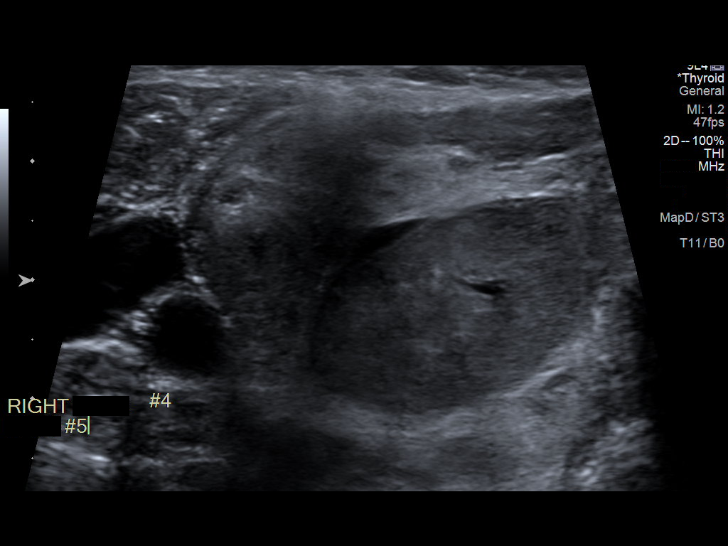
[im 23/25]
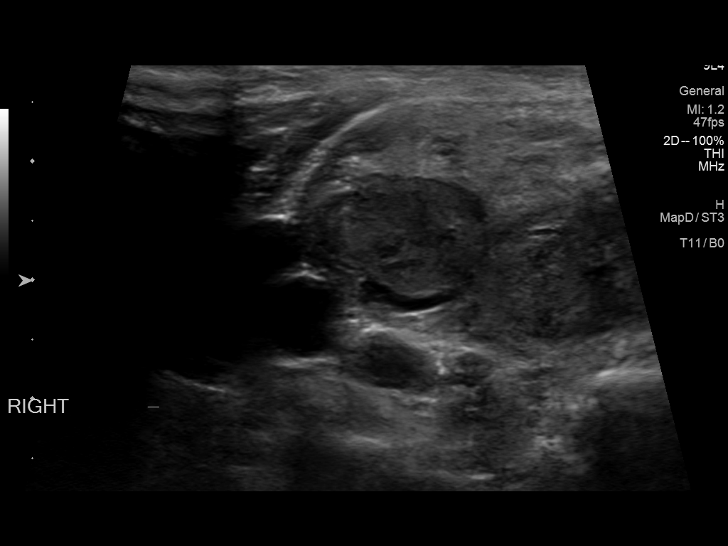
[im 25/25]
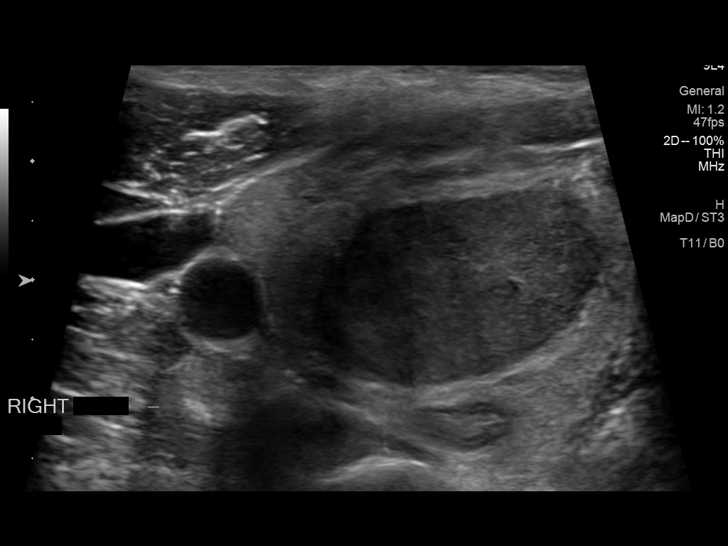

[13 of 25 positions shown; findings below may reference images not displayed]

MEDICATIONS:
1% lidocaine to skin and subcutaneous tissue

COMPLICATIONS:
None immediate.
Pre-procedural ultrasound scanning demonstrated unchanged size and
appearance of the indeterminate nodules within the right mid thyroid
lobe

The procedure was planned. The neck was prepped in the usual sterile
fashion, and a sterile drape was applied covering the operative
field. A timeout was performed prior to the initiation of the
procedure. Local anesthesia was provided with 1% lidocaine.

Under direct ultrasound guidance, 6 FNA biopsies were performed of
the right lateral/mid thyroid nodule with 25 gauge needles. Multiple
ultrasound images were saved for procedural documentation purposes.
The samples were prepared and submitted to pathology as well as for
Afirma testing.

Under direct ultrasound guidance, 6 FNA biopsies were performed of
the right mid thyroid nodule with 25 gauge needles. Multiple
ultrasound images were saved for procedural documentation purposes.
The samples were prepared and submitted to pathology as well as for
Afirma testing.

Limited post procedural scanning was negative for hematoma or
additional complication. Dressings were placed. The patient
tolerated the above procedures procedure well without immediate
postprocedural complication.
FINDINGS: Nodule reference number based on prior diagnostic ultrasound: 3

Maximum size: 2.0 cm

Location: Right; Mid

ACR TI-RADS risk category: TR4 (4-6 points)

Reason for biopsy: meets ACR TI-RADS criteria

_________________________________________________________

Nodule reference number based on prior diagnostic ultrasound: 4

Maximum size: 3.2 cm

Location: Right; Mid

ACR TI-RADS risk category: TR4 (4-6 points)

Reason for biopsy: meets ACR TI-RADS criteria

Ultrasound imaging confirms appropriate placement of the needles
within the thyroid nodules.
IMPRESSION: 1. Technically successful ultrasound guided fine needle aspiration
biopsy of right lateral/mid thyroid nodule.
2. Technically successful ultrasound guided fine needle aspiration
biopsy of right mid thyroid nodule. Final pathology pending.

## 2021-11-24 DIAGNOSIS — E0590 Thyrotoxicosis, unspecified without thyrotoxic crisis or storm: Secondary | ICD-10-CM | POA: Diagnosis not present

## 2021-12-31 DIAGNOSIS — E042 Nontoxic multinodular goiter: Secondary | ICD-10-CM | POA: Diagnosis not present

## 2021-12-31 DIAGNOSIS — R7303 Prediabetes: Secondary | ICD-10-CM | POA: Diagnosis not present

## 2021-12-31 DIAGNOSIS — I10 Essential (primary) hypertension: Secondary | ICD-10-CM | POA: Diagnosis not present

## 2021-12-31 DIAGNOSIS — Z8639 Personal history of other endocrine, nutritional and metabolic disease: Secondary | ICD-10-CM | POA: Diagnosis not present

## 2022-01-12 DIAGNOSIS — I10 Essential (primary) hypertension: Secondary | ICD-10-CM | POA: Diagnosis not present

## 2022-01-12 DIAGNOSIS — M25561 Pain in right knee: Secondary | ICD-10-CM | POA: Diagnosis not present

## 2022-01-12 DIAGNOSIS — Z6841 Body Mass Index (BMI) 40.0 and over, adult: Secondary | ICD-10-CM | POA: Diagnosis not present

## 2022-01-28 DIAGNOSIS — M25562 Pain in left knee: Secondary | ICD-10-CM | POA: Diagnosis not present

## 2022-01-28 DIAGNOSIS — M25561 Pain in right knee: Secondary | ICD-10-CM | POA: Diagnosis not present

## 2022-04-23 DIAGNOSIS — I10 Essential (primary) hypertension: Secondary | ICD-10-CM | POA: Diagnosis not present

## 2022-04-23 DIAGNOSIS — Z0000 Encounter for general adult medical examination without abnormal findings: Secondary | ICD-10-CM | POA: Diagnosis not present

## 2022-04-23 DIAGNOSIS — E0590 Thyrotoxicosis, unspecified without thyrotoxic crisis or storm: Secondary | ICD-10-CM | POA: Diagnosis not present

## 2022-04-28 DIAGNOSIS — Z01419 Encounter for gynecological examination (general) (routine) without abnormal findings: Secondary | ICD-10-CM | POA: Diagnosis not present

## 2022-04-28 DIAGNOSIS — Z6841 Body Mass Index (BMI) 40.0 and over, adult: Secondary | ICD-10-CM | POA: Diagnosis not present

## 2022-04-28 DIAGNOSIS — Z1231 Encounter for screening mammogram for malignant neoplasm of breast: Secondary | ICD-10-CM | POA: Diagnosis not present

## 2022-06-24 DIAGNOSIS — Z8639 Personal history of other endocrine, nutritional and metabolic disease: Secondary | ICD-10-CM | POA: Diagnosis not present

## 2022-06-24 DIAGNOSIS — R7303 Prediabetes: Secondary | ICD-10-CM | POA: Diagnosis not present

## 2022-06-30 DIAGNOSIS — Z8639 Personal history of other endocrine, nutritional and metabolic disease: Secondary | ICD-10-CM | POA: Diagnosis not present

## 2022-06-30 DIAGNOSIS — Z23 Encounter for immunization: Secondary | ICD-10-CM | POA: Diagnosis not present

## 2022-06-30 DIAGNOSIS — E042 Nontoxic multinodular goiter: Secondary | ICD-10-CM | POA: Diagnosis not present

## 2022-06-30 DIAGNOSIS — R7309 Other abnormal glucose: Secondary | ICD-10-CM | POA: Diagnosis not present

## 2022-07-01 ENCOUNTER — Other Ambulatory Visit: Payer: Self-pay | Admitting: Home Modifications

## 2022-07-01 DIAGNOSIS — E042 Nontoxic multinodular goiter: Secondary | ICD-10-CM

## 2022-07-08 ENCOUNTER — Ambulatory Visit (INDEPENDENT_AMBULATORY_CARE_PROVIDER_SITE_OTHER): Payer: BC Managed Care – PPO

## 2022-07-08 ENCOUNTER — Ambulatory Visit
Admission: RE | Admit: 2022-07-08 | Discharge: 2022-07-08 | Disposition: A | Discharge status: Home / Self Care | Payer: BC Managed Care – PPO | Source: Ambulatory Visit | Attending: Home Modifications | Admitting: Home Modifications

## 2022-07-08 ENCOUNTER — Ambulatory Visit
Admission: EM | Admit: 2022-07-08 | Discharge: 2022-07-08 | Disposition: A | Discharge status: Home / Self Care | Payer: BC Managed Care – PPO

## 2022-07-08 DIAGNOSIS — M40204 Unspecified kyphosis, thoracic region: Secondary | ICD-10-CM | POA: Diagnosis not present

## 2022-07-08 DIAGNOSIS — M546 Pain in thoracic spine: Secondary | ICD-10-CM

## 2022-07-08 DIAGNOSIS — E042 Nontoxic multinodular goiter: Secondary | ICD-10-CM | POA: Diagnosis not present

## 2022-07-08 DIAGNOSIS — L03011 Cellulitis of right finger: Secondary | ICD-10-CM | POA: Diagnosis not present

## 2022-07-08 DIAGNOSIS — R0782 Intercostal pain: Secondary | ICD-10-CM

## 2022-07-08 DIAGNOSIS — W19XXXA Unspecified fall, initial encounter: Secondary | ICD-10-CM

## 2022-07-08 DIAGNOSIS — R0781 Pleurodynia: Secondary | ICD-10-CM

## 2022-07-08 MED ORDER — CEPHALEXIN 500 MG PO CAPS: 500.0000 mg | ORAL_CAPSULE | Freq: Four times a day (QID) | ORAL | 0 refills | Status: AC

## 2022-07-08 MED ORDER — METHOCARBAMOL 500 MG PO TABS: 500.0000 mg | ORAL_TABLET | Freq: Two times a day (BID) | ORAL | 0 refills | Status: AC | PRN

## 2022-07-08 NOTE — ED Provider Notes (Signed)
[name]    CSN: [id number] Arrival date & time: 07/08/22  1700      History   Chief Complaint Chief Complaint  Patient presents with   infection/fall    HPI Lauren Bowen is a 61 y.o. female.   Patient presents with 2 different chief complaints today.  Patient is concerned for infection to distal end of right thumb that she noticed 5 days ago.  Denies any obvious injury to the area.  Patient reports that she noticed pain and swelling surrounding the nail.  Has not taken any medications for symptoms.  Denies any associated fever.  Patient also reports that she is having left sided back pain after a fall that occurred approximately 4 days ago.  Patient reports that she misstepped while carrying a laundry basket and fell landing directly on her left side/back.  Patient having pain in the left thoracic back/rib cage area.  Patient does not report taking medication for pain.  Denies hitting head or losing consciousness during fall.  Denies urinary frequency, urinary or bowel continence, saddle anesthesia.  Denies any associated shortness of breath.     Past Medical History:  Diagnosis Date   Arthritis    LEFT KNEE   Hypertension     Patient Active Problem List   Diagnosis Date Noted   Special screening for malignant neoplasms, colon    Benign neoplasm of transverse colon    DIABETES-TYPE 2 03/19/2010   OBESITY 03/19/2010    Past Surgical History:  Procedure Laterality Date   COLONOSCOPY WITH PROPOFOL N/A 08/05/2020   Procedure: COLONOSCOPY WITH PROPOFOL;  Surgeon: Ladene Artist, MD;  Location: [facility];  Service: Endoscopy;  Laterality: N/A;   MYOMECTOMY     POLYPECTOMY  08/05/2020   Procedure: POLYPECTOMY;  Surgeon: Ladene Artist, MD;  Location: [facility];  Service: Endoscopy;;    OB History   No obstetric history on file.      Home Medications    Prior to Admission medications   Medication Sig Start Date End Date Taking? Authorizing  Provider  cephALEXin (KEFLEX) 500 MG capsule Take 1 capsule (500 mg total) by mouth 4 (four) times daily. 07/08/22  Yes Brewer Hitchman, Michele Rockers, FNP  methocarbamol (ROBAXIN) 500 MG tablet Take 1 tablet (500 mg total) by mouth 2 (two) times daily as needed for muscle spasms. 07/08/22  Yes Viyan Rosamond, Hildred Alamin E, FNP  acetaminophen (TYLENOL) 500 MG tablet Take 500 mg by mouth every 6 (six) hours as needed (pain.).     [provider]  amLODipine (NORVASC) 10 MG tablet Take 10 mg by mouth daily. 05/14/20   [provider]  OZEMPIC, 0.25 OR 0.5 MG/DOSE, 2 MG/3ML SOPN Inject 0.5 mg into the skin once a week. 05/12/22   [provider]    Family History Family History  Problem Relation Age of Onset   Diabetes Mother    Hypertension Mother    Colon polyps Mother    Hypertension Father    Diabetes Father    Colon polyps Father    Hypertension Sister    Heart disease Brother    Hypertension Sister    Kidney disease Sister    Diabetes Maternal Grandmother    Diabetes Maternal Grandfather    Asthma Maternal Grandfather    Heart disease Paternal Grandmother    Colon cancer Neg Hx    Esophageal cancer Neg Hx    Rectal cancer Neg Hx    Stomach cancer Neg Hx  Social History Social History   Tobacco Use   Smoking status: Never   Smokeless tobacco: Never  Vaping Use   Vaping Use: Never used  Substance Use Topics   Alcohol use: No   Drug use: No     Allergies   Patient has no known allergies.   Review of Systems Review of Systems Per HPI  Physical Exam Triage Vital Signs ED Triage Vitals  Enc Vitals Group     BP 07/08/22 1710 (!) 142/82     Pulse Rate 07/08/22 1710 80     Resp 07/08/22 1710 16     Temp 07/08/22 1710 98 F (36.7 C)     Temp Source 07/08/22 1710 Oral     SpO2 07/08/22 1710 97 %     Weight --      Height --      Head Circumference --      Peak Flow --      Pain Score 07/08/22 1711 7     Pain Loc --      Pain Edu? --      Excl. in [place]? --     No data found.  Updated Vital Signs BP (!) 142/82 (BP Location: Left Arm)   Pulse 80   Temp 98 F (36.7 C) (Oral)   Resp 16   SpO2 97%   Visual Acuity Right Eye Distance:   Left Eye Distance:   Bilateral Distance:    Right Eye Near:   Left Eye Near:    Bilateral Near:     Physical Exam Constitutional:      General: She is not in acute distress.    Appearance: Normal appearance. She is not toxic-appearing or diaphoretic.  HENT:     Head: Normocephalic and atraumatic.  Eyes:     Extraocular Movements: Extraocular movements intact.     Conjunctiva/sclera: Conjunctivae normal.  Cardiovascular:     Rate and Rhythm: Normal rate and regular rhythm.     Pulses: Normal pulses.     Heart sounds: Normal heart sounds.  Pulmonary:     Effort: Pulmonary effort is normal. No respiratory distress.     Breath sounds: Normal breath sounds.  Musculoskeletal:       Back:     Comments: Tenderness to palpation to circled area on diagram. No direct spinal tenderness, crepitus, step-off.  Mild swelling and bruising noted.  No lacerations or abrasions noted. Grip strength 5/5.  Skin:    Comments: Mild swelling and erythema present to medial portion of skin surrounding right thumbnail.  There is mild purulent discoloration but no fluctuance noted.  Capillary refill and pulses intact.  Patient has full range of motion of thumb.  Neurological:     General: No focal deficit present.     Mental Status: She is alert and oriented to person, place, and time. Mental status is at baseline.  Psychiatric:        Mood and Affect: Mood normal.        Behavior: Behavior normal.        Thought Content: Thought content normal.        Judgment: Judgment normal.      UC Treatments / Results  Labs (all labs ordered are listed, but only abnormal results are displayed) Labs Reviewed - No data to display  EKG   Radiology DG Ribs Unilateral W/Chest Left  Result Date: 07/08/2022 CLINICAL DATA:   Fall.  Left lower posterior rib pain. EXAM: LEFT RIBS AND CHEST -  3+ VIEW COMPARISON:  Chest radiographs 03/17/2018 FINDINGS: The cardiomediastinal silhouette is within normal limits. No airspace consolidation, edema, sizable pleural effusion, pneumothorax is identified. No acute rib fracture is identified. IMPRESSION: No acute rib fracture identified. Electronically Signed   By: Logan Bores M.D.   On: 07/08/2022 17:48   DG Thoracic Spine 2 View  Result Date: 07/08/2022 CLINICAL DATA:  Fall.  Left lower posterior rib pain. EXAM: THORACIC SPINE 2 VIEWS COMPARISON:  None Available. FINDINGS: Image quality is degraded by body habitus. There are 11 full sized pairs of ribs. Ribs at T12 are hypoplastic or absent. There is straightening of the normal thoracic kyphosis, and there is mild right convex curvature of the mid upper thoracic spine. No definite acute fracture is identified. Spondylosis is noted with bulky lateral vertebral osteophytes throughout the majority of the thoracic spine. IMPRESSION: No acute osseous abnormality identified. Electronically Signed   By: Logan Bores M.D.   On: 07/08/2022 17:44    Procedures Procedures (including critical care time)  Medications Ordered in UC Medications - No data to display  Initial Impression / Assessment and Plan / UC Course  I have reviewed the triage vital signs and the nursing notes.  Pertinent labs & imaging results that were available during my care of the patient were reviewed by me and considered in my medical decision making (see chart for details).     Patient has paronychia of right thumb.  Will treat with cephalexin antibiotic.  Patient reports she started taking amoxicillin for current dental infection but is not able to confirm whether it is amoxicillin or Augmentin.  She is also not sure how much of the medication she has left.  Therefore, will simply add cephalexin as opposed to changing antibiotic to cover for both.  Patient's  x-rays are negative for any acute bony abnormality.  Suspect contusion versus muscular injury/strain related to mechanism of injury.  I do think patient would benefit from muscle relaxer.  Advised patient that this can cause drowsiness and do not drive or drink alcohol with taking this.  Patient reports she has taken this before and tolerated well.  Also advised over-the-counter pain relievers such as Tylenol which should be safe.  Advised supportive care such as ice application as well.  Do not think that dosage adjustment is necessary for cephalexin or Robaxin given CKD or elevated creatinine is not noted in patient's history at this time.  Patient to follow-up if symptoms persist or worsen.  Patient verbalized understanding and was agreeable with plan. Final Clinical Impressions(s) / UC Diagnoses   Final diagnoses:  Paronychia of right thumb  Fall, initial encounter  Rib pain on left side  Acute left-sided thoracic back pain     Discharge Instructions      You have an infection of the skin of your thumb.  This is being treated with an antibiotic.  You may take this antibiotic with your amoxicillin.  Please use warm Epsom salt soaks as well for about 20 minutes at a time about 2-3 times a day.  Follow-up if it persists or worsens.  The x-rays were negative for any fracture or dislocation.  Suspect bruising related to your pain.  I have prescribed a muscle relaxer to alleviate discomfort.  Please be advised that this can cause drowsiness and do not drive or drink alcohol with taking this.  Follow-up if symptoms persist or worsen.     ED Prescriptions     Medication Sig Dispense Auth. Provider  cephALEXin (KEFLEX) 500 MG capsule Take 1 capsule (500 mg total) by mouth 4 (four) times daily. 28 capsule Goodenow, Mercedes E, [place]   methocarbamol (ROBAXIN) 500 MG tablet Take 1 tablet (500 mg total) by mouth 2 (two) times daily as needed for muscle spasms. 20 tablet [place], Michele Rockers, [place]      PDMP not  reviewed this encounter.   Teodora Medici, [place] 07/08/22 1824

## 2022-07-08 NOTE — Discharge Instructions (Signed)
You have an infection of the skin of your thumb.  This is being treated with an antibiotic.  You may take this antibiotic with your amoxicillin.  Please use warm Epsom salt soaks as well for about 20 minutes at a time about 2-3 times a day.  Follow-up if it persists or worsens.  The x-rays were negative for any fracture or dislocation.  Suspect bruising related to your pain.  I have prescribed a muscle relaxer to alleviate discomfort.  Please be advised that this can cause drowsiness and do not drive or drink alcohol with taking this.  Follow-up if symptoms persist or worsen.

## 2022-07-08 NOTE — ED Triage Notes (Signed)
Pt c/o throbbing pain in right thumb since ~ Saturday concerned for infection.   Also c/o fall at home on Sunday landing on left side which continues to cause pain.

## 2022-09-01 ENCOUNTER — Telehealth: Payer: Self-pay

## 2022-09-01 NOTE — Patient Outreach (Signed)
  Care Coordination   09/01/2022 Name: Lauren Bowen MRN: [id number] DOB: 1961/07/31   Care Coordination Outreach Attempts:  An unsuccessful telephone outreach was attempted today to offer the patient information about available care coordination services as a benefit of their health plan.   Follow Up Plan:  Additional outreach attempts will be made to offer the patient care coordination information and services.   Encounter Outcome:  No Answer   Care Coordination Interventions:  No, not indicated    [name], RN, MSN [facility] Care Management Coordinator Direct Line [phone]

## 2022-09-18 ENCOUNTER — Telehealth: Payer: Self-pay

## 2022-09-18 NOTE — Patient Outreach (Signed)
  Care Coordination   09/18/2022 Name: [name] MRN: [id number] DOB: 1960-10-01   Care Coordination Outreach Attempts:  A second unsuccessful outreach was attempted today to offer the patient with information about available care coordination services as a benefit of their health plan.     Follow Up Plan:  Additional outreach attempts will be made to offer the patient care coordination information and services.   Encounter Outcome:  No Answer   Care Coordination Interventions:  No, not indicated    [name], RN, MSN [facility] Care Management Coordinator Direct Line [phone]

## 2022-10-01 ENCOUNTER — Telehealth: Payer: Self-pay

## 2022-10-01 NOTE — Patient Outreach (Signed)
  Care Coordination   10/01/2022 Name: Lauren Bowen MRN: [id number] DOB: [birth year]   Care Coordination Outreach Attempts:  A third unsuccessful outreach was attempted today to offer the patient with information about available care coordination services as a benefit of their health plan.   Follow Up Plan:  No further outreach attempts will be made at this time. We have been unable to contact the patient to offer or enroll patient in care coordination services  Encounter Outcome:  No Answer   Care Coordination Interventions:  No, not indicated    [name], RN, MSN [facility] Care Management Coordinator Direct Line [phone]

## 2022-10-26 DIAGNOSIS — I10 Essential (primary) hypertension: Secondary | ICD-10-CM | POA: Diagnosis not present

## 2022-12-21 DIAGNOSIS — R7309 Other abnormal glucose: Secondary | ICD-10-CM | POA: Diagnosis not present

## 2022-12-21 DIAGNOSIS — Z8639 Personal history of other endocrine, nutritional and metabolic disease: Secondary | ICD-10-CM | POA: Diagnosis not present

## 2023-01-06 ENCOUNTER — Other Ambulatory Visit (HOSPITAL_COMMUNITY): Payer: Self-pay

## 2023-01-06 DIAGNOSIS — I10 Essential (primary) hypertension: Secondary | ICD-10-CM | POA: Diagnosis not present

## 2023-01-06 DIAGNOSIS — Z8639 Personal history of other endocrine, nutritional and metabolic disease: Secondary | ICD-10-CM | POA: Diagnosis not present

## 2023-01-06 DIAGNOSIS — E042 Nontoxic multinodular goiter: Secondary | ICD-10-CM | POA: Diagnosis not present

## 2023-01-06 MED ORDER — OZEMPIC (1 MG/DOSE) 4 MG/3ML ~~LOC~~ SOPN
1.0000 mg | PEN_INJECTOR | SUBCUTANEOUS | 3 refills | Status: AC
  Filled 2023-01-06: qty 3, 28d supply, fill #0
  Filled 2023-01-30: qty 3, 28d supply, fill #1
  Filled 2023-03-02: qty 3, 28d supply, fill #2
  Filled 2023-03-30: qty 3, 28d supply, fill #3
  Filled 2023-05-23: qty 3, 28d supply, fill #4
  Filled 2023-06-19: qty 3, 28d supply, fill #5
  Filled 2023-07-15: qty 3, 28d supply, fill #6

## 2023-03-30 ENCOUNTER — Other Ambulatory Visit (HOSPITAL_COMMUNITY): Payer: Self-pay

## 2023-04-06 ENCOUNTER — Other Ambulatory Visit (HOSPITAL_COMMUNITY): Payer: Self-pay

## 2023-04-07 ENCOUNTER — Other Ambulatory Visit (HOSPITAL_COMMUNITY): Payer: Self-pay

## 2023-04-12 ENCOUNTER — Other Ambulatory Visit (HOSPITAL_COMMUNITY): Payer: Self-pay

## 2023-04-14 ENCOUNTER — Other Ambulatory Visit (HOSPITAL_COMMUNITY): Payer: Self-pay

## 2023-04-16 ENCOUNTER — Other Ambulatory Visit (HOSPITAL_COMMUNITY): Payer: Self-pay

## 2023-04-20 ENCOUNTER — Other Ambulatory Visit (HOSPITAL_COMMUNITY): Payer: Self-pay

## 2023-04-26 DIAGNOSIS — R7303 Prediabetes: Secondary | ICD-10-CM | POA: Diagnosis not present

## 2023-04-26 DIAGNOSIS — E0590 Thyrotoxicosis, unspecified without thyrotoxic crisis or storm: Secondary | ICD-10-CM | POA: Diagnosis not present

## 2023-04-26 DIAGNOSIS — I10 Essential (primary) hypertension: Secondary | ICD-10-CM | POA: Diagnosis not present

## 2023-04-26 DIAGNOSIS — E042 Nontoxic multinodular goiter: Secondary | ICD-10-CM | POA: Diagnosis not present

## 2023-05-07 DIAGNOSIS — Z1231 Encounter for screening mammogram for malignant neoplasm of breast: Secondary | ICD-10-CM | POA: Diagnosis not present

## 2023-05-07 DIAGNOSIS — Z01419 Encounter for gynecological examination (general) (routine) without abnormal findings: Secondary | ICD-10-CM | POA: Diagnosis not present

## 2023-05-07 DIAGNOSIS — Z124 Encounter for screening for malignant neoplasm of cervix: Secondary | ICD-10-CM | POA: Diagnosis not present

## 2023-05-07 DIAGNOSIS — Z6841 Body Mass Index (BMI) 40.0 and over, adult: Secondary | ICD-10-CM | POA: Diagnosis not present

## 2023-05-11 DIAGNOSIS — R748 Abnormal levels of other serum enzymes: Secondary | ICD-10-CM | POA: Diagnosis not present

## 2023-05-11 DIAGNOSIS — Z0001 Encounter for general adult medical examination with abnormal findings: Secondary | ICD-10-CM | POA: Diagnosis not present

## 2023-05-11 DIAGNOSIS — I10 Essential (primary) hypertension: Secondary | ICD-10-CM | POA: Diagnosis not present

## 2023-05-24 ENCOUNTER — Other Ambulatory Visit (HOSPITAL_COMMUNITY): Payer: Self-pay

## 2023-05-26 DIAGNOSIS — K760 Fatty (change of) liver, not elsewhere classified: Secondary | ICD-10-CM | POA: Diagnosis not present

## 2023-05-26 DIAGNOSIS — R748 Abnormal levels of other serum enzymes: Secondary | ICD-10-CM | POA: Diagnosis not present

## 2023-05-26 DIAGNOSIS — R160 Hepatomegaly, not elsewhere classified: Secondary | ICD-10-CM | POA: Diagnosis not present

## 2023-06-22 ENCOUNTER — Other Ambulatory Visit (HOSPITAL_COMMUNITY): Payer: Self-pay

## 2023-07-08 ENCOUNTER — Other Ambulatory Visit: Payer: Self-pay | Admitting: Nurse Practitioner

## 2023-07-08 DIAGNOSIS — I10 Essential (primary) hypertension: Secondary | ICD-10-CM | POA: Diagnosis not present

## 2023-07-08 DIAGNOSIS — E042 Nontoxic multinodular goiter: Secondary | ICD-10-CM | POA: Diagnosis not present

## 2023-07-15 DIAGNOSIS — E042 Nontoxic multinodular goiter: Secondary | ICD-10-CM | POA: Diagnosis not present

## 2023-07-15 DIAGNOSIS — I10 Essential (primary) hypertension: Secondary | ICD-10-CM | POA: Diagnosis not present

## 2023-07-16 ENCOUNTER — Ambulatory Visit
Admission: RE | Admit: 2023-07-16 | Discharge: 2023-07-16 | Disposition: A | Discharge status: Home / Self Care | Payer: BC Managed Care – PPO | Source: Ambulatory Visit | Attending: Nurse Practitioner | Admitting: Nurse Practitioner

## 2023-07-16 ENCOUNTER — Other Ambulatory Visit (HOSPITAL_COMMUNITY): Payer: Self-pay

## 2023-07-16 DIAGNOSIS — E041 Nontoxic single thyroid nodule: Secondary | ICD-10-CM | POA: Diagnosis not present

## 2023-07-16 DIAGNOSIS — E042 Nontoxic multinodular goiter: Secondary | ICD-10-CM

## 2023-07-19 ENCOUNTER — Other Ambulatory Visit (HOSPITAL_COMMUNITY): Payer: Self-pay

## 2023-07-19 ENCOUNTER — Other Ambulatory Visit: Payer: Self-pay

## 2023-07-21 ENCOUNTER — Other Ambulatory Visit (HOSPITAL_COMMUNITY): Payer: Self-pay

## 2023-08-03 ENCOUNTER — Other Ambulatory Visit (HOSPITAL_COMMUNITY): Payer: Self-pay

## 2023-08-13 ENCOUNTER — Other Ambulatory Visit: Payer: Self-pay

## 2024-01-11 DIAGNOSIS — R7303 Prediabetes: Secondary | ICD-10-CM | POA: Diagnosis not present

## 2024-01-11 DIAGNOSIS — E042 Nontoxic multinodular goiter: Secondary | ICD-10-CM | POA: Diagnosis not present

## 2024-01-11 DIAGNOSIS — E0590 Thyrotoxicosis, unspecified without thyrotoxic crisis or storm: Secondary | ICD-10-CM | POA: Diagnosis not present

## 2024-01-11 DIAGNOSIS — I10 Essential (primary) hypertension: Secondary | ICD-10-CM | POA: Diagnosis not present

## 2024-01-18 DIAGNOSIS — E041 Nontoxic single thyroid nodule: Secondary | ICD-10-CM | POA: Diagnosis not present

## 2024-01-18 DIAGNOSIS — E042 Nontoxic multinodular goiter: Secondary | ICD-10-CM | POA: Diagnosis not present

## 2024-01-18 DIAGNOSIS — Z8639 Personal history of other endocrine, nutritional and metabolic disease: Secondary | ICD-10-CM | POA: Diagnosis not present

## 2024-01-18 DIAGNOSIS — I10 Essential (primary) hypertension: Secondary | ICD-10-CM | POA: Diagnosis not present

## 2024-05-08 DIAGNOSIS — E0590 Thyrotoxicosis, unspecified without thyrotoxic crisis or storm: Secondary | ICD-10-CM | POA: Diagnosis not present

## 2024-05-08 DIAGNOSIS — I10 Essential (primary) hypertension: Secondary | ICD-10-CM | POA: Diagnosis not present

## 2024-05-08 DIAGNOSIS — E042 Nontoxic multinodular goiter: Secondary | ICD-10-CM | POA: Diagnosis not present

## 2024-05-08 DIAGNOSIS — R7309 Other abnormal glucose: Secondary | ICD-10-CM | POA: Diagnosis not present

## 2024-05-15 DIAGNOSIS — R7303 Prediabetes: Secondary | ICD-10-CM | POA: Diagnosis not present

## 2024-05-15 DIAGNOSIS — Z0000 Encounter for general adult medical examination without abnormal findings: Secondary | ICD-10-CM | POA: Diagnosis not present

## 2024-05-15 DIAGNOSIS — Z23 Encounter for immunization: Secondary | ICD-10-CM | POA: Diagnosis not present

## 2024-05-15 DIAGNOSIS — I10 Essential (primary) hypertension: Secondary | ICD-10-CM | POA: Diagnosis not present

## 2024-05-15 DIAGNOSIS — K760 Fatty (change of) liver, not elsewhere classified: Secondary | ICD-10-CM | POA: Diagnosis not present

## 2024-07-13 DIAGNOSIS — Z6841 Body Mass Index (BMI) 40.0 and over, adult: Secondary | ICD-10-CM | POA: Diagnosis not present

## 2024-07-13 DIAGNOSIS — Z1231 Encounter for screening mammogram for malignant neoplasm of breast: Secondary | ICD-10-CM | POA: Diagnosis not present

## 2024-07-13 DIAGNOSIS — Z01419 Encounter for gynecological examination (general) (routine) without abnormal findings: Secondary | ICD-10-CM | POA: Diagnosis not present

## 2024-07-19 ENCOUNTER — Other Ambulatory Visit: Payer: Self-pay | Admitting: Nurse Practitioner

## 2024-07-19 DIAGNOSIS — R7309 Other abnormal glucose: Secondary | ICD-10-CM | POA: Diagnosis not present

## 2024-07-19 DIAGNOSIS — E0590 Thyrotoxicosis, unspecified without thyrotoxic crisis or storm: Secondary | ICD-10-CM | POA: Diagnosis not present

## 2024-07-19 DIAGNOSIS — E042 Nontoxic multinodular goiter: Secondary | ICD-10-CM

## 2024-07-25 ENCOUNTER — Ambulatory Visit
Admission: RE | Admit: 2024-07-25 | Discharge: 2024-07-25 | Disposition: A | Source: Ambulatory Visit | Attending: Nurse Practitioner

## 2024-07-25 DIAGNOSIS — E042 Nontoxic multinodular goiter: Secondary | ICD-10-CM | POA: Diagnosis not present

## 2024-07-26 DIAGNOSIS — E041 Nontoxic single thyroid nodule: Secondary | ICD-10-CM | POA: Diagnosis not present

## 2024-07-26 DIAGNOSIS — Z8639 Personal history of other endocrine, nutritional and metabolic disease: Secondary | ICD-10-CM | POA: Diagnosis not present

## 2024-07-26 DIAGNOSIS — K760 Fatty (change of) liver, not elsewhere classified: Secondary | ICD-10-CM | POA: Diagnosis not present

## 2024-07-26 DIAGNOSIS — R7303 Prediabetes: Secondary | ICD-10-CM | POA: Diagnosis not present

## 2024-08-18 DIAGNOSIS — N907 Vulvar cyst: Secondary | ICD-10-CM | POA: Diagnosis not present
